# Patient Record
Sex: Male | Born: 1945
Health system: Southern US, Community
[De-identification: ages and names within clinical notes are randomized; demographics above are authoritative.]

---

## 2016-01-30 DIAGNOSIS — M545 Low back pain: Secondary | ICD-10-CM | POA: Diagnosis not present

## 2016-01-30 DIAGNOSIS — I10 Essential (primary) hypertension: Secondary | ICD-10-CM | POA: Diagnosis not present

## 2016-01-30 DIAGNOSIS — Z0000 Encounter for general adult medical examination without abnormal findings: Secondary | ICD-10-CM | POA: Diagnosis not present

## 2016-01-30 DIAGNOSIS — Z681 Body mass index (BMI) 19 or less, adult: Secondary | ICD-10-CM | POA: Diagnosis not present

## 2016-01-30 DIAGNOSIS — J441 Chronic obstructive pulmonary disease with (acute) exacerbation: Secondary | ICD-10-CM | POA: Diagnosis not present

## 2016-03-26 DIAGNOSIS — Z1211 Encounter for screening for malignant neoplasm of colon: Secondary | ICD-10-CM | POA: Diagnosis not present

## 2016-03-27 DIAGNOSIS — I7389 Other specified peripheral vascular diseases: Secondary | ICD-10-CM | POA: Diagnosis not present

## 2016-03-27 DIAGNOSIS — Z0000 Encounter for general adult medical examination without abnormal findings: Secondary | ICD-10-CM | POA: Diagnosis not present

## 2016-03-27 DIAGNOSIS — G40802 Other epilepsy, not intractable, without status epilepticus: Secondary | ICD-10-CM | POA: Diagnosis not present

## 2016-03-27 DIAGNOSIS — I10 Essential (primary) hypertension: Secondary | ICD-10-CM | POA: Diagnosis not present

## 2016-03-27 DIAGNOSIS — I63412 Cerebral infarction due to embolism of left middle cerebral artery: Secondary | ICD-10-CM | POA: Diagnosis not present

## 2016-03-27 DIAGNOSIS — J441 Chronic obstructive pulmonary disease with (acute) exacerbation: Secondary | ICD-10-CM | POA: Diagnosis not present

## 2016-03-27 DIAGNOSIS — Z1389 Encounter for screening for other disorder: Secondary | ICD-10-CM | POA: Diagnosis not present

## 2016-04-05 DIAGNOSIS — I10 Essential (primary) hypertension: Secondary | ICD-10-CM | POA: Diagnosis not present

## 2016-04-05 DIAGNOSIS — F17210 Nicotine dependence, cigarettes, uncomplicated: Secondary | ICD-10-CM | POA: Diagnosis not present

## 2016-04-05 DIAGNOSIS — D125 Benign neoplasm of sigmoid colon: Secondary | ICD-10-CM | POA: Diagnosis not present

## 2016-04-05 DIAGNOSIS — Z882 Allergy status to sulfonamides status: Secondary | ICD-10-CM | POA: Diagnosis not present

## 2016-04-05 DIAGNOSIS — Z8673 Personal history of transient ischemic attack (TIA), and cerebral infarction without residual deficits: Secondary | ICD-10-CM | POA: Diagnosis not present

## 2016-04-05 DIAGNOSIS — Z79899 Other long term (current) drug therapy: Secondary | ICD-10-CM | POA: Diagnosis not present

## 2016-04-05 DIAGNOSIS — Z9049 Acquired absence of other specified parts of digestive tract: Secondary | ICD-10-CM | POA: Diagnosis not present

## 2016-04-05 DIAGNOSIS — Z8489 Family history of other specified conditions: Secondary | ICD-10-CM | POA: Diagnosis not present

## 2016-04-05 DIAGNOSIS — Z1211 Encounter for screening for malignant neoplasm of colon: Secondary | ICD-10-CM | POA: Diagnosis not present

## 2016-05-28 DIAGNOSIS — I63412 Cerebral infarction due to embolism of left middle cerebral artery: Secondary | ICD-10-CM | POA: Diagnosis not present

## 2016-05-28 DIAGNOSIS — I10 Essential (primary) hypertension: Secondary | ICD-10-CM | POA: Diagnosis not present

## 2016-05-28 DIAGNOSIS — I7389 Other specified peripheral vascular diseases: Secondary | ICD-10-CM | POA: Diagnosis not present

## 2016-05-28 DIAGNOSIS — J441 Chronic obstructive pulmonary disease with (acute) exacerbation: Secondary | ICD-10-CM | POA: Diagnosis not present

## 2016-07-27 DIAGNOSIS — I63412 Cerebral infarction due to embolism of left middle cerebral artery: Secondary | ICD-10-CM | POA: Diagnosis not present

## 2016-07-27 DIAGNOSIS — J441 Chronic obstructive pulmonary disease with (acute) exacerbation: Secondary | ICD-10-CM | POA: Diagnosis not present

## 2016-07-27 DIAGNOSIS — I10 Essential (primary) hypertension: Secondary | ICD-10-CM | POA: Diagnosis not present

## 2016-07-27 DIAGNOSIS — I7389 Other specified peripheral vascular diseases: Secondary | ICD-10-CM | POA: Diagnosis not present

## 2016-07-30 DIAGNOSIS — Z79899 Other long term (current) drug therapy: Secondary | ICD-10-CM | POA: Diagnosis not present

## 2016-07-30 DIAGNOSIS — E440 Moderate protein-calorie malnutrition: Secondary | ICD-10-CM | POA: Diagnosis not present

## 2016-07-30 DIAGNOSIS — Z79891 Long term (current) use of opiate analgesic: Secondary | ICD-10-CM | POA: Diagnosis not present

## 2016-07-30 DIAGNOSIS — G8194 Hemiplegia, unspecified affecting left nondominant side: Secondary | ICD-10-CM | POA: Diagnosis not present

## 2016-07-30 DIAGNOSIS — Z7982 Long term (current) use of aspirin: Secondary | ICD-10-CM | POA: Diagnosis not present

## 2016-07-30 DIAGNOSIS — I638 Other cerebral infarction: Secondary | ICD-10-CM | POA: Diagnosis not present

## 2016-07-30 DIAGNOSIS — J449 Chronic obstructive pulmonary disease, unspecified: Secondary | ICD-10-CM | POA: Diagnosis not present

## 2016-07-30 DIAGNOSIS — Z882 Allergy status to sulfonamides status: Secondary | ICD-10-CM | POA: Diagnosis not present

## 2016-07-30 DIAGNOSIS — I6330 Cerebral infarction due to thrombosis of unspecified cerebral artery: Secondary | ICD-10-CM | POA: Diagnosis not present

## 2016-07-30 DIAGNOSIS — I10 Essential (primary) hypertension: Secondary | ICD-10-CM | POA: Diagnosis not present

## 2016-07-30 DIAGNOSIS — R4781 Slurred speech: Secondary | ICD-10-CM | POA: Diagnosis not present

## 2016-07-30 DIAGNOSIS — I639 Cerebral infarction, unspecified: Secondary | ICD-10-CM | POA: Diagnosis not present

## 2016-07-30 DIAGNOSIS — G8929 Other chronic pain: Secondary | ICD-10-CM | POA: Diagnosis not present

## 2016-07-30 DIAGNOSIS — G8112 Spastic hemiplegia affecting left dominant side: Secondary | ICD-10-CM | POA: Diagnosis not present

## 2016-07-30 DIAGNOSIS — E43 Unspecified severe protein-calorie malnutrition: Secondary | ICD-10-CM | POA: Diagnosis not present

## 2016-07-30 DIAGNOSIS — M545 Low back pain: Secondary | ICD-10-CM | POA: Diagnosis not present

## 2016-07-30 DIAGNOSIS — I6523 Occlusion and stenosis of bilateral carotid arteries: Secondary | ICD-10-CM | POA: Diagnosis not present

## 2016-08-02 DIAGNOSIS — Z7982 Long term (current) use of aspirin: Secondary | ICD-10-CM | POA: Diagnosis not present

## 2016-08-02 DIAGNOSIS — Z7901 Long term (current) use of anticoagulants: Secondary | ICD-10-CM | POA: Diagnosis not present

## 2016-08-02 DIAGNOSIS — I69328 Other speech and language deficits following cerebral infarction: Secondary | ICD-10-CM | POA: Diagnosis not present

## 2016-08-02 DIAGNOSIS — I10 Essential (primary) hypertension: Secondary | ICD-10-CM | POA: Diagnosis not present

## 2016-08-02 DIAGNOSIS — M545 Low back pain: Secondary | ICD-10-CM | POA: Diagnosis not present

## 2016-08-02 DIAGNOSIS — J441 Chronic obstructive pulmonary disease with (acute) exacerbation: Secondary | ICD-10-CM | POA: Diagnosis not present

## 2016-08-02 DIAGNOSIS — I69354 Hemiplegia and hemiparesis following cerebral infarction affecting left non-dominant side: Secondary | ICD-10-CM | POA: Diagnosis not present

## 2016-08-02 DIAGNOSIS — J449 Chronic obstructive pulmonary disease, unspecified: Secondary | ICD-10-CM | POA: Diagnosis not present

## 2016-08-03 DIAGNOSIS — I69328 Other speech and language deficits following cerebral infarction: Secondary | ICD-10-CM | POA: Diagnosis not present

## 2016-08-03 DIAGNOSIS — Z7901 Long term (current) use of anticoagulants: Secondary | ICD-10-CM | POA: Diagnosis not present

## 2016-08-03 DIAGNOSIS — J449 Chronic obstructive pulmonary disease, unspecified: Secondary | ICD-10-CM | POA: Diagnosis not present

## 2016-08-03 DIAGNOSIS — Z7982 Long term (current) use of aspirin: Secondary | ICD-10-CM | POA: Diagnosis not present

## 2016-08-03 DIAGNOSIS — M545 Low back pain: Secondary | ICD-10-CM | POA: Diagnosis not present

## 2016-08-03 DIAGNOSIS — I10 Essential (primary) hypertension: Secondary | ICD-10-CM | POA: Diagnosis not present

## 2016-08-03 DIAGNOSIS — I69354 Hemiplegia and hemiparesis following cerebral infarction affecting left non-dominant side: Secondary | ICD-10-CM | POA: Diagnosis not present

## 2016-08-07 DIAGNOSIS — Z7982 Long term (current) use of aspirin: Secondary | ICD-10-CM | POA: Diagnosis not present

## 2016-08-07 DIAGNOSIS — J449 Chronic obstructive pulmonary disease, unspecified: Secondary | ICD-10-CM | POA: Diagnosis not present

## 2016-08-07 DIAGNOSIS — I10 Essential (primary) hypertension: Secondary | ICD-10-CM | POA: Diagnosis not present

## 2016-08-07 DIAGNOSIS — I69354 Hemiplegia and hemiparesis following cerebral infarction affecting left non-dominant side: Secondary | ICD-10-CM | POA: Diagnosis not present

## 2016-08-07 DIAGNOSIS — M545 Low back pain: Secondary | ICD-10-CM | POA: Diagnosis not present

## 2016-08-07 DIAGNOSIS — I69328 Other speech and language deficits following cerebral infarction: Secondary | ICD-10-CM | POA: Diagnosis not present

## 2016-08-07 DIAGNOSIS — Z7901 Long term (current) use of anticoagulants: Secondary | ICD-10-CM | POA: Diagnosis not present

## 2016-08-08 DIAGNOSIS — M545 Low back pain: Secondary | ICD-10-CM | POA: Diagnosis not present

## 2016-08-08 DIAGNOSIS — J449 Chronic obstructive pulmonary disease, unspecified: Secondary | ICD-10-CM | POA: Diagnosis not present

## 2016-08-08 DIAGNOSIS — I69354 Hemiplegia and hemiparesis following cerebral infarction affecting left non-dominant side: Secondary | ICD-10-CM | POA: Diagnosis not present

## 2016-08-08 DIAGNOSIS — I10 Essential (primary) hypertension: Secondary | ICD-10-CM | POA: Diagnosis not present

## 2016-08-08 DIAGNOSIS — I69328 Other speech and language deficits following cerebral infarction: Secondary | ICD-10-CM | POA: Diagnosis not present

## 2016-08-08 DIAGNOSIS — Z7982 Long term (current) use of aspirin: Secondary | ICD-10-CM | POA: Diagnosis not present

## 2016-08-08 DIAGNOSIS — Z7901 Long term (current) use of anticoagulants: Secondary | ICD-10-CM | POA: Diagnosis not present

## 2016-08-09 DIAGNOSIS — I69328 Other speech and language deficits following cerebral infarction: Secondary | ICD-10-CM | POA: Diagnosis not present

## 2016-08-09 DIAGNOSIS — J449 Chronic obstructive pulmonary disease, unspecified: Secondary | ICD-10-CM | POA: Diagnosis not present

## 2016-08-09 DIAGNOSIS — I69354 Hemiplegia and hemiparesis following cerebral infarction affecting left non-dominant side: Secondary | ICD-10-CM | POA: Diagnosis not present

## 2016-08-09 DIAGNOSIS — I10 Essential (primary) hypertension: Secondary | ICD-10-CM | POA: Diagnosis not present

## 2016-08-09 DIAGNOSIS — Z7982 Long term (current) use of aspirin: Secondary | ICD-10-CM | POA: Diagnosis not present

## 2016-08-09 DIAGNOSIS — M545 Low back pain: Secondary | ICD-10-CM | POA: Diagnosis not present

## 2016-08-09 DIAGNOSIS — Z7901 Long term (current) use of anticoagulants: Secondary | ICD-10-CM | POA: Diagnosis not present

## 2016-08-10 DIAGNOSIS — I7389 Other specified peripheral vascular diseases: Secondary | ICD-10-CM | POA: Diagnosis not present

## 2016-08-10 DIAGNOSIS — Z7901 Long term (current) use of anticoagulants: Secondary | ICD-10-CM | POA: Diagnosis not present

## 2016-08-10 DIAGNOSIS — I69328 Other speech and language deficits following cerebral infarction: Secondary | ICD-10-CM | POA: Diagnosis not present

## 2016-08-10 DIAGNOSIS — I10 Essential (primary) hypertension: Secondary | ICD-10-CM | POA: Diagnosis not present

## 2016-08-10 DIAGNOSIS — I69354 Hemiplegia and hemiparesis following cerebral infarction affecting left non-dominant side: Secondary | ICD-10-CM | POA: Diagnosis not present

## 2016-08-10 DIAGNOSIS — M545 Low back pain: Secondary | ICD-10-CM | POA: Diagnosis not present

## 2016-08-10 DIAGNOSIS — J449 Chronic obstructive pulmonary disease, unspecified: Secondary | ICD-10-CM | POA: Diagnosis not present

## 2016-08-10 DIAGNOSIS — Z7982 Long term (current) use of aspirin: Secondary | ICD-10-CM | POA: Diagnosis not present

## 2016-08-10 DIAGNOSIS — I63411 Cerebral infarction due to embolism of right middle cerebral artery: Secondary | ICD-10-CM | POA: Diagnosis not present

## 2016-08-13 DIAGNOSIS — I10 Essential (primary) hypertension: Secondary | ICD-10-CM | POA: Diagnosis not present

## 2016-08-13 DIAGNOSIS — I69354 Hemiplegia and hemiparesis following cerebral infarction affecting left non-dominant side: Secondary | ICD-10-CM | POA: Diagnosis not present

## 2016-08-13 DIAGNOSIS — Z7901 Long term (current) use of anticoagulants: Secondary | ICD-10-CM | POA: Diagnosis not present

## 2016-08-13 DIAGNOSIS — J449 Chronic obstructive pulmonary disease, unspecified: Secondary | ICD-10-CM | POA: Diagnosis not present

## 2016-08-13 DIAGNOSIS — M545 Low back pain: Secondary | ICD-10-CM | POA: Diagnosis not present

## 2016-08-13 DIAGNOSIS — I69328 Other speech and language deficits following cerebral infarction: Secondary | ICD-10-CM | POA: Diagnosis not present

## 2016-08-13 DIAGNOSIS — Z7982 Long term (current) use of aspirin: Secondary | ICD-10-CM | POA: Diagnosis not present

## 2016-08-14 DIAGNOSIS — Z7901 Long term (current) use of anticoagulants: Secondary | ICD-10-CM | POA: Diagnosis not present

## 2016-08-14 DIAGNOSIS — M545 Low back pain: Secondary | ICD-10-CM | POA: Diagnosis not present

## 2016-08-14 DIAGNOSIS — J449 Chronic obstructive pulmonary disease, unspecified: Secondary | ICD-10-CM | POA: Diagnosis not present

## 2016-08-14 DIAGNOSIS — I69328 Other speech and language deficits following cerebral infarction: Secondary | ICD-10-CM | POA: Diagnosis not present

## 2016-08-14 DIAGNOSIS — Z7982 Long term (current) use of aspirin: Secondary | ICD-10-CM | POA: Diagnosis not present

## 2016-08-14 DIAGNOSIS — I69354 Hemiplegia and hemiparesis following cerebral infarction affecting left non-dominant side: Secondary | ICD-10-CM | POA: Diagnosis not present

## 2016-08-14 DIAGNOSIS — I10 Essential (primary) hypertension: Secondary | ICD-10-CM | POA: Diagnosis not present

## 2016-08-15 DIAGNOSIS — I69354 Hemiplegia and hemiparesis following cerebral infarction affecting left non-dominant side: Secondary | ICD-10-CM | POA: Diagnosis not present

## 2016-08-15 DIAGNOSIS — I69328 Other speech and language deficits following cerebral infarction: Secondary | ICD-10-CM | POA: Diagnosis not present

## 2016-08-15 DIAGNOSIS — Z7982 Long term (current) use of aspirin: Secondary | ICD-10-CM | POA: Diagnosis not present

## 2016-08-15 DIAGNOSIS — Z7901 Long term (current) use of anticoagulants: Secondary | ICD-10-CM | POA: Diagnosis not present

## 2016-08-15 DIAGNOSIS — J449 Chronic obstructive pulmonary disease, unspecified: Secondary | ICD-10-CM | POA: Diagnosis not present

## 2016-08-15 DIAGNOSIS — I10 Essential (primary) hypertension: Secondary | ICD-10-CM | POA: Diagnosis not present

## 2016-08-15 DIAGNOSIS — M545 Low back pain: Secondary | ICD-10-CM | POA: Diagnosis not present

## 2016-08-16 DIAGNOSIS — J449 Chronic obstructive pulmonary disease, unspecified: Secondary | ICD-10-CM | POA: Diagnosis not present

## 2016-08-16 DIAGNOSIS — Z7901 Long term (current) use of anticoagulants: Secondary | ICD-10-CM | POA: Diagnosis not present

## 2016-08-16 DIAGNOSIS — I69328 Other speech and language deficits following cerebral infarction: Secondary | ICD-10-CM | POA: Diagnosis not present

## 2016-08-16 DIAGNOSIS — Z7982 Long term (current) use of aspirin: Secondary | ICD-10-CM | POA: Diagnosis not present

## 2016-08-16 DIAGNOSIS — M545 Low back pain: Secondary | ICD-10-CM | POA: Diagnosis not present

## 2016-08-16 DIAGNOSIS — I69354 Hemiplegia and hemiparesis following cerebral infarction affecting left non-dominant side: Secondary | ICD-10-CM | POA: Diagnosis not present

## 2016-08-16 DIAGNOSIS — I10 Essential (primary) hypertension: Secondary | ICD-10-CM | POA: Diagnosis not present

## 2016-08-21 DIAGNOSIS — Z7982 Long term (current) use of aspirin: Secondary | ICD-10-CM | POA: Diagnosis not present

## 2016-08-21 DIAGNOSIS — I69354 Hemiplegia and hemiparesis following cerebral infarction affecting left non-dominant side: Secondary | ICD-10-CM | POA: Diagnosis not present

## 2016-08-21 DIAGNOSIS — J449 Chronic obstructive pulmonary disease, unspecified: Secondary | ICD-10-CM | POA: Diagnosis not present

## 2016-08-21 DIAGNOSIS — I69328 Other speech and language deficits following cerebral infarction: Secondary | ICD-10-CM | POA: Diagnosis not present

## 2016-08-21 DIAGNOSIS — I10 Essential (primary) hypertension: Secondary | ICD-10-CM | POA: Diagnosis not present

## 2016-08-21 DIAGNOSIS — M545 Low back pain: Secondary | ICD-10-CM | POA: Diagnosis not present

## 2016-08-21 DIAGNOSIS — Z7901 Long term (current) use of anticoagulants: Secondary | ICD-10-CM | POA: Diagnosis not present

## 2016-09-01 DIAGNOSIS — I6330 Cerebral infarction due to thrombosis of unspecified cerebral artery: Secondary | ICD-10-CM | POA: Diagnosis not present

## 2016-09-05 DIAGNOSIS — G894 Chronic pain syndrome: Secondary | ICD-10-CM | POA: Diagnosis not present

## 2016-09-05 DIAGNOSIS — M545 Low back pain: Secondary | ICD-10-CM | POA: Diagnosis not present

## 2016-09-05 DIAGNOSIS — Z79891 Long term (current) use of opiate analgesic: Secondary | ICD-10-CM | POA: Diagnosis not present

## 2016-09-25 DIAGNOSIS — I63411 Cerebral infarction due to embolism of right middle cerebral artery: Secondary | ICD-10-CM | POA: Diagnosis not present

## 2016-09-25 DIAGNOSIS — E440 Moderate protein-calorie malnutrition: Secondary | ICD-10-CM | POA: Diagnosis not present

## 2016-10-01 DIAGNOSIS — I6330 Cerebral infarction due to thrombosis of unspecified cerebral artery: Secondary | ICD-10-CM | POA: Diagnosis not present

## 2016-11-01 DIAGNOSIS — I6330 Cerebral infarction due to thrombosis of unspecified cerebral artery: Secondary | ICD-10-CM | POA: Diagnosis not present

## 2016-11-22 DIAGNOSIS — Z79899 Other long term (current) drug therapy: Secondary | ICD-10-CM | POA: Diagnosis not present

## 2016-11-22 DIAGNOSIS — M545 Low back pain: Secondary | ICD-10-CM | POA: Diagnosis not present

## 2016-11-22 DIAGNOSIS — E440 Moderate protein-calorie malnutrition: Secondary | ICD-10-CM | POA: Diagnosis not present

## 2016-12-01 DIAGNOSIS — I6330 Cerebral infarction due to thrombosis of unspecified cerebral artery: Secondary | ICD-10-CM | POA: Diagnosis not present

## 2017-01-01 DIAGNOSIS — I6330 Cerebral infarction due to thrombosis of unspecified cerebral artery: Secondary | ICD-10-CM | POA: Diagnosis not present

## 2017-01-22 DIAGNOSIS — I638 Other cerebral infarction: Secondary | ICD-10-CM | POA: Diagnosis not present

## 2017-01-22 DIAGNOSIS — M545 Low back pain: Secondary | ICD-10-CM | POA: Diagnosis not present

## 2017-01-22 DIAGNOSIS — J440 Chronic obstructive pulmonary disease with acute lower respiratory infection: Secondary | ICD-10-CM | POA: Diagnosis not present

## 2017-01-22 DIAGNOSIS — Z0000 Encounter for general adult medical examination without abnormal findings: Secondary | ICD-10-CM | POA: Diagnosis not present

## 2017-01-22 DIAGNOSIS — Z1389 Encounter for screening for other disorder: Secondary | ICD-10-CM | POA: Diagnosis not present

## 2017-01-22 DIAGNOSIS — Z125 Encounter for screening for malignant neoplasm of prostate: Secondary | ICD-10-CM | POA: Diagnosis not present

## 2017-01-30 DIAGNOSIS — I6330 Cerebral infarction due to thrombosis of unspecified cerebral artery: Secondary | ICD-10-CM | POA: Diagnosis not present

## 2017-01-31 DIAGNOSIS — Z87891 Personal history of nicotine dependence: Secondary | ICD-10-CM | POA: Diagnosis not present

## 2017-01-31 DIAGNOSIS — Z136 Encounter for screening for cardiovascular disorders: Secondary | ICD-10-CM | POA: Diagnosis not present

## 2017-03-01 DIAGNOSIS — I6330 Cerebral infarction due to thrombosis of unspecified cerebral artery: Secondary | ICD-10-CM | POA: Diagnosis not present

## 2017-03-22 DIAGNOSIS — M25511 Pain in right shoulder: Secondary | ICD-10-CM | POA: Diagnosis not present

## 2017-03-22 DIAGNOSIS — Z0000 Encounter for general adult medical examination without abnormal findings: Secondary | ICD-10-CM | POA: Diagnosis not present

## 2017-03-22 DIAGNOSIS — I638 Other cerebral infarction: Secondary | ICD-10-CM | POA: Diagnosis not present

## 2017-03-22 DIAGNOSIS — J440 Chronic obstructive pulmonary disease with acute lower respiratory infection: Secondary | ICD-10-CM | POA: Diagnosis not present

## 2017-03-22 DIAGNOSIS — M545 Low back pain: Secondary | ICD-10-CM | POA: Diagnosis not present

## 2017-04-01 DIAGNOSIS — I6330 Cerebral infarction due to thrombosis of unspecified cerebral artery: Secondary | ICD-10-CM | POA: Diagnosis not present

## 2017-05-01 DIAGNOSIS — I6330 Cerebral infarction due to thrombosis of unspecified cerebral artery: Secondary | ICD-10-CM | POA: Diagnosis not present

## 2017-05-23 DIAGNOSIS — I10 Essential (primary) hypertension: Secondary | ICD-10-CM | POA: Diagnosis not present

## 2017-05-23 DIAGNOSIS — I638 Other cerebral infarction: Secondary | ICD-10-CM | POA: Diagnosis not present

## 2017-05-23 DIAGNOSIS — M25511 Pain in right shoulder: Secondary | ICD-10-CM | POA: Diagnosis not present

## 2017-05-23 DIAGNOSIS — J440 Chronic obstructive pulmonary disease with acute lower respiratory infection: Secondary | ICD-10-CM | POA: Diagnosis not present

## 2017-07-29 DIAGNOSIS — J440 Chronic obstructive pulmonary disease with acute lower respiratory infection: Secondary | ICD-10-CM | POA: Diagnosis not present

## 2017-07-29 DIAGNOSIS — I10 Essential (primary) hypertension: Secondary | ICD-10-CM | POA: Diagnosis not present

## 2017-07-29 DIAGNOSIS — M25511 Pain in right shoulder: Secondary | ICD-10-CM | POA: Diagnosis not present

## 2017-07-29 DIAGNOSIS — I638 Other cerebral infarction: Secondary | ICD-10-CM | POA: Diagnosis not present

## 2017-09-30 DIAGNOSIS — I6389 Other cerebral infarction: Secondary | ICD-10-CM | POA: Diagnosis not present

## 2017-09-30 DIAGNOSIS — M545 Low back pain: Secondary | ICD-10-CM | POA: Diagnosis not present

## 2017-09-30 DIAGNOSIS — J440 Chronic obstructive pulmonary disease with acute lower respiratory infection: Secondary | ICD-10-CM | POA: Diagnosis not present

## 2017-09-30 DIAGNOSIS — I10 Essential (primary) hypertension: Secondary | ICD-10-CM | POA: Diagnosis not present

## 2017-11-29 DIAGNOSIS — I10 Essential (primary) hypertension: Secondary | ICD-10-CM | POA: Diagnosis not present

## 2017-11-29 DIAGNOSIS — J441 Chronic obstructive pulmonary disease with (acute) exacerbation: Secondary | ICD-10-CM | POA: Diagnosis not present

## 2017-11-29 DIAGNOSIS — M545 Low back pain: Secondary | ICD-10-CM | POA: Diagnosis not present

## 2018-01-28 DIAGNOSIS — J441 Chronic obstructive pulmonary disease with (acute) exacerbation: Secondary | ICD-10-CM | POA: Diagnosis not present

## 2018-01-28 DIAGNOSIS — E440 Moderate protein-calorie malnutrition: Secondary | ICD-10-CM | POA: Diagnosis not present

## 2018-01-28 DIAGNOSIS — M545 Low back pain: Secondary | ICD-10-CM | POA: Diagnosis not present

## 2018-01-28 DIAGNOSIS — I10 Essential (primary) hypertension: Secondary | ICD-10-CM | POA: Diagnosis not present

## 2018-03-27 DIAGNOSIS — G40309 Generalized idiopathic epilepsy and epileptic syndromes, not intractable, without status epilepticus: Secondary | ICD-10-CM | POA: Diagnosis not present

## 2018-03-27 DIAGNOSIS — I10 Essential (primary) hypertension: Secondary | ICD-10-CM | POA: Diagnosis not present

## 2018-03-27 DIAGNOSIS — Z0000 Encounter for general adult medical examination without abnormal findings: Secondary | ICD-10-CM | POA: Diagnosis not present

## 2018-03-27 DIAGNOSIS — E440 Moderate protein-calorie malnutrition: Secondary | ICD-10-CM | POA: Diagnosis not present

## 2018-03-27 DIAGNOSIS — Z1389 Encounter for screening for other disorder: Secondary | ICD-10-CM | POA: Diagnosis not present

## 2018-03-27 DIAGNOSIS — M545 Low back pain: Secondary | ICD-10-CM | POA: Diagnosis not present

## 2018-03-27 DIAGNOSIS — J441 Chronic obstructive pulmonary disease with (acute) exacerbation: Secondary | ICD-10-CM | POA: Diagnosis not present

## 2018-03-27 DIAGNOSIS — Z681 Body mass index (BMI) 19 or less, adult: Secondary | ICD-10-CM | POA: Diagnosis not present

## 2018-03-27 DIAGNOSIS — R69 Illness, unspecified: Secondary | ICD-10-CM | POA: Diagnosis not present

## 2018-05-05 DIAGNOSIS — M130 Polyarthritis, unspecified: Secondary | ICD-10-CM | POA: Diagnosis not present

## 2018-05-05 DIAGNOSIS — M545 Low back pain: Secondary | ICD-10-CM | POA: Diagnosis not present

## 2018-05-05 DIAGNOSIS — M542 Cervicalgia: Secondary | ICD-10-CM | POA: Diagnosis not present

## 2018-05-05 DIAGNOSIS — G603 Idiopathic progressive neuropathy: Secondary | ICD-10-CM | POA: Diagnosis not present

## 2018-05-05 DIAGNOSIS — M79672 Pain in left foot: Secondary | ICD-10-CM | POA: Diagnosis not present

## 2018-05-12 ENCOUNTER — Other Ambulatory Visit (HOSPITAL_COMMUNITY): Payer: Self-pay | Admitting: Neurology

## 2018-05-12 ENCOUNTER — Ambulatory Visit (HOSPITAL_COMMUNITY)
Admission: RE | Admit: 2018-05-12 | Discharge: 2018-05-12 | Disposition: A | Discharge status: Home / Self Care | Payer: Medicare HMO | Source: Ambulatory Visit | Attending: Neurology | Admitting: Neurology

## 2018-05-12 DIAGNOSIS — M5440 Lumbago with sciatica, unspecified side: Secondary | ICD-10-CM

## 2018-05-12 DIAGNOSIS — M545 Low back pain: Secondary | ICD-10-CM | POA: Insufficient documentation

## 2018-05-12 DIAGNOSIS — M5136 Other intervertebral disc degeneration, lumbar region: Secondary | ICD-10-CM | POA: Diagnosis not present

## 2018-05-12 DIAGNOSIS — I7090 Unspecified atherosclerosis: Secondary | ICD-10-CM | POA: Insufficient documentation

## 2018-06-06 DIAGNOSIS — J441 Chronic obstructive pulmonary disease with (acute) exacerbation: Secondary | ICD-10-CM | POA: Diagnosis not present

## 2018-06-06 DIAGNOSIS — R69 Illness, unspecified: Secondary | ICD-10-CM | POA: Diagnosis not present

## 2018-06-06 DIAGNOSIS — E440 Moderate protein-calorie malnutrition: Secondary | ICD-10-CM | POA: Diagnosis not present

## 2018-06-06 DIAGNOSIS — G40309 Generalized idiopathic epilepsy and epileptic syndromes, not intractable, without status epilepticus: Secondary | ICD-10-CM | POA: Diagnosis not present

## 2018-06-06 DIAGNOSIS — I10 Essential (primary) hypertension: Secondary | ICD-10-CM | POA: Diagnosis not present

## 2018-06-06 DIAGNOSIS — Z681 Body mass index (BMI) 19 or less, adult: Secondary | ICD-10-CM | POA: Diagnosis not present

## 2018-06-06 DIAGNOSIS — M545 Low back pain: Secondary | ICD-10-CM | POA: Diagnosis not present

## 2018-06-13 DIAGNOSIS — R339 Retention of urine, unspecified: Secondary | ICD-10-CM | POA: Diagnosis not present

## 2018-06-13 DIAGNOSIS — S60222A Contusion of left hand, initial encounter: Secondary | ICD-10-CM | POA: Diagnosis not present

## 2018-06-13 DIAGNOSIS — S60229A Contusion of unspecified hand, initial encounter: Secondary | ICD-10-CM | POA: Diagnosis not present

## 2018-06-13 DIAGNOSIS — S6992XA Unspecified injury of left wrist, hand and finger(s), initial encounter: Secondary | ICD-10-CM | POA: Diagnosis not present

## 2018-06-13 DIAGNOSIS — I10 Essential (primary) hypertension: Secondary | ICD-10-CM | POA: Diagnosis not present

## 2018-06-13 DIAGNOSIS — Z7982 Long term (current) use of aspirin: Secondary | ICD-10-CM | POA: Diagnosis not present

## 2018-06-13 DIAGNOSIS — S60512A Abrasion of left hand, initial encounter: Secondary | ICD-10-CM | POA: Diagnosis not present

## 2018-06-13 DIAGNOSIS — W1809XA Striking against other object with subsequent fall, initial encounter: Secondary | ICD-10-CM | POA: Diagnosis not present

## 2018-06-13 DIAGNOSIS — S60511A Abrasion of right hand, initial encounter: Secondary | ICD-10-CM | POA: Diagnosis not present

## 2018-06-13 DIAGNOSIS — R69 Illness, unspecified: Secondary | ICD-10-CM | POA: Diagnosis not present

## 2018-06-13 DIAGNOSIS — R569 Unspecified convulsions: Secondary | ICD-10-CM | POA: Diagnosis not present

## 2018-06-13 DIAGNOSIS — Z79899 Other long term (current) drug therapy: Secondary | ICD-10-CM | POA: Diagnosis not present

## 2018-06-13 DIAGNOSIS — W010XXA Fall on same level from slipping, tripping and stumbling without subsequent striking against object, initial encounter: Secondary | ICD-10-CM | POA: Diagnosis not present

## 2018-07-15 DIAGNOSIS — M542 Cervicalgia: Secondary | ICD-10-CM | POA: Diagnosis not present

## 2018-07-15 DIAGNOSIS — G603 Idiopathic progressive neuropathy: Secondary | ICD-10-CM | POA: Diagnosis not present

## 2018-07-15 DIAGNOSIS — M130 Polyarthritis, unspecified: Secondary | ICD-10-CM | POA: Diagnosis not present

## 2018-07-15 DIAGNOSIS — M545 Low back pain: Secondary | ICD-10-CM | POA: Diagnosis not present

## 2018-07-24 DIAGNOSIS — E039 Hypothyroidism, unspecified: Secondary | ICD-10-CM | POA: Diagnosis not present

## 2018-07-24 DIAGNOSIS — D519 Vitamin B12 deficiency anemia, unspecified: Secondary | ICD-10-CM | POA: Diagnosis not present

## 2018-07-24 DIAGNOSIS — R5383 Other fatigue: Secondary | ICD-10-CM | POA: Diagnosis not present

## 2018-07-24 DIAGNOSIS — E559 Vitamin D deficiency, unspecified: Secondary | ICD-10-CM | POA: Diagnosis not present

## 2018-07-24 DIAGNOSIS — G603 Idiopathic progressive neuropathy: Secondary | ICD-10-CM | POA: Diagnosis not present

## 2018-07-24 DIAGNOSIS — E569 Vitamin deficiency, unspecified: Secondary | ICD-10-CM | POA: Diagnosis not present

## 2018-07-24 DIAGNOSIS — M5416 Radiculopathy, lumbar region: Secondary | ICD-10-CM | POA: Diagnosis not present

## 2018-07-24 DIAGNOSIS — Z79899 Other long term (current) drug therapy: Secondary | ICD-10-CM | POA: Diagnosis not present

## 2018-08-15 DIAGNOSIS — M542 Cervicalgia: Secondary | ICD-10-CM | POA: Diagnosis not present

## 2018-08-15 DIAGNOSIS — M130 Polyarthritis, unspecified: Secondary | ICD-10-CM | POA: Diagnosis not present

## 2018-08-15 DIAGNOSIS — G603 Idiopathic progressive neuropathy: Secondary | ICD-10-CM | POA: Diagnosis not present

## 2018-08-15 DIAGNOSIS — M545 Low back pain: Secondary | ICD-10-CM | POA: Diagnosis not present

## 2018-09-11 DIAGNOSIS — M542 Cervicalgia: Secondary | ICD-10-CM | POA: Diagnosis not present

## 2018-09-11 DIAGNOSIS — Z79891 Long term (current) use of opiate analgesic: Secondary | ICD-10-CM | POA: Diagnosis not present

## 2018-09-11 DIAGNOSIS — G603 Idiopathic progressive neuropathy: Secondary | ICD-10-CM | POA: Diagnosis not present

## 2018-09-11 DIAGNOSIS — M545 Low back pain: Secondary | ICD-10-CM | POA: Diagnosis not present

## 2018-09-11 DIAGNOSIS — M130 Polyarthritis, unspecified: Secondary | ICD-10-CM | POA: Diagnosis not present

## 2018-09-24 DIAGNOSIS — M545 Low back pain: Secondary | ICD-10-CM | POA: Diagnosis not present

## 2018-09-24 DIAGNOSIS — Z681 Body mass index (BMI) 19 or less, adult: Secondary | ICD-10-CM | POA: Diagnosis not present

## 2018-09-24 DIAGNOSIS — R69 Illness, unspecified: Secondary | ICD-10-CM | POA: Diagnosis not present

## 2018-09-24 DIAGNOSIS — I10 Essential (primary) hypertension: Secondary | ICD-10-CM | POA: Diagnosis not present

## 2018-09-24 DIAGNOSIS — J441 Chronic obstructive pulmonary disease with (acute) exacerbation: Secondary | ICD-10-CM | POA: Diagnosis not present

## 2018-09-24 DIAGNOSIS — Z1159 Encounter for screening for other viral diseases: Secondary | ICD-10-CM | POA: Diagnosis not present

## 2018-09-24 DIAGNOSIS — E440 Moderate protein-calorie malnutrition: Secondary | ICD-10-CM | POA: Diagnosis not present

## 2018-09-24 DIAGNOSIS — G40309 Generalized idiopathic epilepsy and epileptic syndromes, not intractable, without status epilepticus: Secondary | ICD-10-CM | POA: Diagnosis not present

## 2018-10-02 DIAGNOSIS — G458 Other transient cerebral ischemic attacks and related syndromes: Secondary | ICD-10-CM | POA: Diagnosis not present

## 2018-10-02 DIAGNOSIS — I739 Peripheral vascular disease, unspecified: Secondary | ICD-10-CM | POA: Diagnosis not present

## 2018-10-08 DIAGNOSIS — M542 Cervicalgia: Secondary | ICD-10-CM | POA: Diagnosis not present

## 2018-10-08 DIAGNOSIS — M545 Low back pain: Secondary | ICD-10-CM | POA: Diagnosis not present

## 2018-10-08 DIAGNOSIS — Z79891 Long term (current) use of opiate analgesic: Secondary | ICD-10-CM | POA: Diagnosis not present

## 2018-10-08 DIAGNOSIS — M130 Polyarthritis, unspecified: Secondary | ICD-10-CM | POA: Diagnosis not present

## 2018-10-08 DIAGNOSIS — G603 Idiopathic progressive neuropathy: Secondary | ICD-10-CM | POA: Diagnosis not present

## 2018-11-06 DIAGNOSIS — M542 Cervicalgia: Secondary | ICD-10-CM | POA: Diagnosis not present

## 2018-11-06 DIAGNOSIS — M545 Low back pain: Secondary | ICD-10-CM | POA: Diagnosis not present

## 2018-11-06 DIAGNOSIS — G603 Idiopathic progressive neuropathy: Secondary | ICD-10-CM | POA: Diagnosis not present

## 2018-11-06 DIAGNOSIS — Z79891 Long term (current) use of opiate analgesic: Secondary | ICD-10-CM | POA: Diagnosis not present

## 2018-11-06 DIAGNOSIS — M130 Polyarthritis, unspecified: Secondary | ICD-10-CM | POA: Diagnosis not present

## 2018-12-04 DIAGNOSIS — G603 Idiopathic progressive neuropathy: Secondary | ICD-10-CM | POA: Diagnosis not present

## 2018-12-04 DIAGNOSIS — Z79891 Long term (current) use of opiate analgesic: Secondary | ICD-10-CM | POA: Diagnosis not present

## 2018-12-04 DIAGNOSIS — M542 Cervicalgia: Secondary | ICD-10-CM | POA: Diagnosis not present

## 2018-12-04 DIAGNOSIS — M130 Polyarthritis, unspecified: Secondary | ICD-10-CM | POA: Diagnosis not present

## 2018-12-04 DIAGNOSIS — M545 Low back pain: Secondary | ICD-10-CM | POA: Diagnosis not present

## 2018-12-13 DIAGNOSIS — G8194 Hemiplegia, unspecified affecting left nondominant side: Secondary | ICD-10-CM | POA: Diagnosis not present

## 2018-12-13 DIAGNOSIS — I6781 Acute cerebrovascular insufficiency: Secondary | ICD-10-CM | POA: Diagnosis not present

## 2018-12-13 DIAGNOSIS — I639 Cerebral infarction, unspecified: Secondary | ICD-10-CM | POA: Diagnosis not present

## 2018-12-13 DIAGNOSIS — J449 Chronic obstructive pulmonary disease, unspecified: Secondary | ICD-10-CM | POA: Diagnosis not present

## 2018-12-13 DIAGNOSIS — I10 Essential (primary) hypertension: Secondary | ICD-10-CM | POA: Diagnosis not present

## 2018-12-13 DIAGNOSIS — R69 Illness, unspecified: Secondary | ICD-10-CM | POA: Diagnosis not present

## 2018-12-13 DIAGNOSIS — R479 Unspecified speech disturbances: Secondary | ICD-10-CM | POA: Diagnosis not present

## 2018-12-13 DIAGNOSIS — G8929 Other chronic pain: Secondary | ICD-10-CM | POA: Diagnosis not present

## 2018-12-13 DIAGNOSIS — R531 Weakness: Secondary | ICD-10-CM | POA: Diagnosis not present

## 2018-12-13 DIAGNOSIS — M545 Low back pain: Secondary | ICD-10-CM | POA: Diagnosis not present

## 2018-12-13 DIAGNOSIS — E440 Moderate protein-calorie malnutrition: Secondary | ICD-10-CM | POA: Diagnosis not present

## 2018-12-13 DIAGNOSIS — R569 Unspecified convulsions: Secondary | ICD-10-CM | POA: Diagnosis not present

## 2018-12-13 DIAGNOSIS — R4781 Slurred speech: Secondary | ICD-10-CM | POA: Diagnosis not present

## 2018-12-14 DIAGNOSIS — R69 Illness, unspecified: Secondary | ICD-10-CM | POA: Diagnosis not present

## 2018-12-14 DIAGNOSIS — M545 Low back pain: Secondary | ICD-10-CM | POA: Diagnosis not present

## 2018-12-14 DIAGNOSIS — G8929 Other chronic pain: Secondary | ICD-10-CM | POA: Diagnosis not present

## 2018-12-14 DIAGNOSIS — J449 Chronic obstructive pulmonary disease, unspecified: Secondary | ICD-10-CM | POA: Diagnosis not present

## 2018-12-14 DIAGNOSIS — I10 Essential (primary) hypertension: Secondary | ICD-10-CM | POA: Diagnosis not present

## 2018-12-14 DIAGNOSIS — I6781 Acute cerebrovascular insufficiency: Secondary | ICD-10-CM | POA: Diagnosis not present

## 2018-12-14 DIAGNOSIS — R569 Unspecified convulsions: Secondary | ICD-10-CM | POA: Diagnosis not present

## 2018-12-14 DIAGNOSIS — I679 Cerebrovascular disease, unspecified: Secondary | ICD-10-CM | POA: Diagnosis not present

## 2018-12-14 DIAGNOSIS — R4781 Slurred speech: Secondary | ICD-10-CM | POA: Diagnosis not present

## 2018-12-14 DIAGNOSIS — H7491 Unspecified disorder of right middle ear and mastoid: Secondary | ICD-10-CM | POA: Diagnosis not present

## 2018-12-14 DIAGNOSIS — R479 Unspecified speech disturbances: Secondary | ICD-10-CM | POA: Diagnosis not present

## 2018-12-14 DIAGNOSIS — R531 Weakness: Secondary | ICD-10-CM | POA: Diagnosis not present

## 2018-12-14 DIAGNOSIS — G8194 Hemiplegia, unspecified affecting left nondominant side: Secondary | ICD-10-CM | POA: Diagnosis not present

## 2018-12-14 DIAGNOSIS — E440 Moderate protein-calorie malnutrition: Secondary | ICD-10-CM | POA: Diagnosis not present

## 2018-12-15 DIAGNOSIS — H7491 Unspecified disorder of right middle ear and mastoid: Secondary | ICD-10-CM | POA: Diagnosis not present

## 2018-12-15 DIAGNOSIS — J449 Chronic obstructive pulmonary disease, unspecified: Secondary | ICD-10-CM | POA: Diagnosis not present

## 2018-12-15 DIAGNOSIS — G8194 Hemiplegia, unspecified affecting left nondominant side: Secondary | ICD-10-CM | POA: Diagnosis not present

## 2018-12-15 DIAGNOSIS — E440 Moderate protein-calorie malnutrition: Secondary | ICD-10-CM | POA: Diagnosis not present

## 2018-12-15 DIAGNOSIS — R4781 Slurred speech: Secondary | ICD-10-CM | POA: Diagnosis not present

## 2018-12-15 DIAGNOSIS — I6781 Acute cerebrovascular insufficiency: Secondary | ICD-10-CM | POA: Diagnosis not present

## 2018-12-15 DIAGNOSIS — I679 Cerebrovascular disease, unspecified: Secondary | ICD-10-CM | POA: Diagnosis not present

## 2018-12-16 DIAGNOSIS — J449 Chronic obstructive pulmonary disease, unspecified: Secondary | ICD-10-CM | POA: Diagnosis not present

## 2018-12-16 DIAGNOSIS — E440 Moderate protein-calorie malnutrition: Secondary | ICD-10-CM | POA: Diagnosis not present

## 2018-12-16 DIAGNOSIS — R4781 Slurred speech: Secondary | ICD-10-CM | POA: Diagnosis not present

## 2018-12-16 DIAGNOSIS — I6781 Acute cerebrovascular insufficiency: Secondary | ICD-10-CM | POA: Diagnosis not present

## 2018-12-16 DIAGNOSIS — G8194 Hemiplegia, unspecified affecting left nondominant side: Secondary | ICD-10-CM | POA: Diagnosis not present

## 2019-01-05 DIAGNOSIS — G40309 Generalized idiopathic epilepsy and epileptic syndromes, not intractable, without status epilepticus: Secondary | ICD-10-CM | POA: Diagnosis not present

## 2019-01-05 DIAGNOSIS — M545 Low back pain: Secondary | ICD-10-CM | POA: Diagnosis not present

## 2019-01-05 DIAGNOSIS — R69 Illness, unspecified: Secondary | ICD-10-CM | POA: Diagnosis not present

## 2019-01-05 DIAGNOSIS — I10 Essential (primary) hypertension: Secondary | ICD-10-CM | POA: Diagnosis not present

## 2019-01-05 DIAGNOSIS — J441 Chronic obstructive pulmonary disease with (acute) exacerbation: Secondary | ICD-10-CM | POA: Diagnosis not present

## 2019-01-05 DIAGNOSIS — Z681 Body mass index (BMI) 19 or less, adult: Secondary | ICD-10-CM | POA: Diagnosis not present

## 2019-01-05 DIAGNOSIS — E440 Moderate protein-calorie malnutrition: Secondary | ICD-10-CM | POA: Diagnosis not present

## 2019-02-26 DIAGNOSIS — Z79891 Long term (current) use of opiate analgesic: Secondary | ICD-10-CM | POA: Diagnosis not present

## 2019-02-26 DIAGNOSIS — M542 Cervicalgia: Secondary | ICD-10-CM | POA: Diagnosis not present

## 2019-02-26 DIAGNOSIS — M130 Polyarthritis, unspecified: Secondary | ICD-10-CM | POA: Diagnosis not present

## 2019-02-26 DIAGNOSIS — G603 Idiopathic progressive neuropathy: Secondary | ICD-10-CM | POA: Diagnosis not present

## 2019-04-08 DIAGNOSIS — M542 Cervicalgia: Secondary | ICD-10-CM | POA: Diagnosis not present

## 2019-04-08 DIAGNOSIS — M130 Polyarthritis, unspecified: Secondary | ICD-10-CM | POA: Diagnosis not present

## 2019-04-08 DIAGNOSIS — G603 Idiopathic progressive neuropathy: Secondary | ICD-10-CM | POA: Diagnosis not present

## 2019-04-08 DIAGNOSIS — Z79891 Long term (current) use of opiate analgesic: Secondary | ICD-10-CM | POA: Diagnosis not present

## 2019-04-30 DIAGNOSIS — E440 Moderate protein-calorie malnutrition: Secondary | ICD-10-CM | POA: Diagnosis not present

## 2019-04-30 DIAGNOSIS — M545 Low back pain: Secondary | ICD-10-CM | POA: Diagnosis not present

## 2019-04-30 DIAGNOSIS — Z0000 Encounter for general adult medical examination without abnormal findings: Secondary | ICD-10-CM | POA: Diagnosis not present

## 2019-04-30 DIAGNOSIS — Z125 Encounter for screening for malignant neoplasm of prostate: Secondary | ICD-10-CM | POA: Diagnosis not present

## 2019-04-30 DIAGNOSIS — Z1389 Encounter for screening for other disorder: Secondary | ICD-10-CM | POA: Diagnosis not present

## 2019-04-30 DIAGNOSIS — Z681 Body mass index (BMI) 19 or less, adult: Secondary | ICD-10-CM | POA: Diagnosis not present

## 2019-04-30 DIAGNOSIS — J449 Chronic obstructive pulmonary disease, unspecified: Secondary | ICD-10-CM | POA: Diagnosis not present

## 2019-04-30 DIAGNOSIS — G40309 Generalized idiopathic epilepsy and epileptic syndromes, not intractable, without status epilepticus: Secondary | ICD-10-CM | POA: Diagnosis not present

## 2019-04-30 DIAGNOSIS — R69 Illness, unspecified: Secondary | ICD-10-CM | POA: Diagnosis not present

## 2019-04-30 DIAGNOSIS — I10 Essential (primary) hypertension: Secondary | ICD-10-CM | POA: Diagnosis not present

## 2019-06-03 DIAGNOSIS — M130 Polyarthritis, unspecified: Secondary | ICD-10-CM | POA: Diagnosis not present

## 2019-06-03 DIAGNOSIS — Z79891 Long term (current) use of opiate analgesic: Secondary | ICD-10-CM | POA: Diagnosis not present

## 2019-06-03 DIAGNOSIS — M542 Cervicalgia: Secondary | ICD-10-CM | POA: Diagnosis not present

## 2019-06-03 DIAGNOSIS — G603 Idiopathic progressive neuropathy: Secondary | ICD-10-CM | POA: Diagnosis not present

## 2019-07-29 DIAGNOSIS — M542 Cervicalgia: Secondary | ICD-10-CM | POA: Diagnosis not present

## 2019-07-29 DIAGNOSIS — Z79891 Long term (current) use of opiate analgesic: Secondary | ICD-10-CM | POA: Diagnosis not present

## 2019-07-29 DIAGNOSIS — G603 Idiopathic progressive neuropathy: Secondary | ICD-10-CM | POA: Diagnosis not present

## 2019-07-29 DIAGNOSIS — M130 Polyarthritis, unspecified: Secondary | ICD-10-CM | POA: Diagnosis not present

## 2019-07-30 DIAGNOSIS — G40309 Generalized idiopathic epilepsy and epileptic syndromes, not intractable, without status epilepticus: Secondary | ICD-10-CM | POA: Diagnosis not present

## 2019-07-30 DIAGNOSIS — R69 Illness, unspecified: Secondary | ICD-10-CM | POA: Diagnosis not present

## 2019-07-30 DIAGNOSIS — I10 Essential (primary) hypertension: Secondary | ICD-10-CM | POA: Diagnosis not present

## 2019-07-30 DIAGNOSIS — E440 Moderate protein-calorie malnutrition: Secondary | ICD-10-CM | POA: Diagnosis not present

## 2019-07-30 DIAGNOSIS — Z681 Body mass index (BMI) 19 or less, adult: Secondary | ICD-10-CM | POA: Diagnosis not present

## 2019-07-30 DIAGNOSIS — M545 Low back pain: Secondary | ICD-10-CM | POA: Diagnosis not present

## 2019-07-30 DIAGNOSIS — J449 Chronic obstructive pulmonary disease, unspecified: Secondary | ICD-10-CM | POA: Diagnosis not present

## 2019-08-05 DIAGNOSIS — H353110 Nonexudative age-related macular degeneration, right eye, stage unspecified: Secondary | ICD-10-CM | POA: Diagnosis not present

## 2019-09-23 DIAGNOSIS — Z79891 Long term (current) use of opiate analgesic: Secondary | ICD-10-CM | POA: Diagnosis not present

## 2019-09-23 DIAGNOSIS — M542 Cervicalgia: Secondary | ICD-10-CM | POA: Diagnosis not present

## 2019-09-23 DIAGNOSIS — G603 Idiopathic progressive neuropathy: Secondary | ICD-10-CM | POA: Diagnosis not present

## 2019-09-23 DIAGNOSIS — M130 Polyarthritis, unspecified: Secondary | ICD-10-CM | POA: Diagnosis not present

## 2019-10-31 DIAGNOSIS — R69 Illness, unspecified: Secondary | ICD-10-CM | POA: Diagnosis not present

## 2019-11-05 DIAGNOSIS — J449 Chronic obstructive pulmonary disease, unspecified: Secondary | ICD-10-CM | POA: Diagnosis not present

## 2019-11-05 DIAGNOSIS — R69 Illness, unspecified: Secondary | ICD-10-CM | POA: Diagnosis not present

## 2019-11-05 DIAGNOSIS — M545 Low back pain: Secondary | ICD-10-CM | POA: Diagnosis not present

## 2019-11-05 DIAGNOSIS — Z681 Body mass index (BMI) 19 or less, adult: Secondary | ICD-10-CM | POA: Diagnosis not present

## 2019-11-05 DIAGNOSIS — E440 Moderate protein-calorie malnutrition: Secondary | ICD-10-CM | POA: Diagnosis not present

## 2019-11-05 DIAGNOSIS — G40309 Generalized idiopathic epilepsy and epileptic syndromes, not intractable, without status epilepticus: Secondary | ICD-10-CM | POA: Diagnosis not present

## 2019-11-05 DIAGNOSIS — I10 Essential (primary) hypertension: Secondary | ICD-10-CM | POA: Diagnosis not present

## 2019-12-10 DIAGNOSIS — H524 Presbyopia: Secondary | ICD-10-CM | POA: Diagnosis not present

## 2019-12-16 DIAGNOSIS — M5136 Other intervertebral disc degeneration, lumbar region: Secondary | ICD-10-CM | POA: Diagnosis not present

## 2019-12-16 DIAGNOSIS — M419 Scoliosis, unspecified: Secondary | ICD-10-CM | POA: Diagnosis not present

## 2019-12-16 DIAGNOSIS — M47816 Spondylosis without myelopathy or radiculopathy, lumbar region: Secondary | ICD-10-CM | POA: Diagnosis not present

## 2019-12-16 DIAGNOSIS — M545 Low back pain: Secondary | ICD-10-CM | POA: Diagnosis not present

## 2019-12-17 DIAGNOSIS — Z79891 Long term (current) use of opiate analgesic: Secondary | ICD-10-CM | POA: Diagnosis not present

## 2019-12-17 DIAGNOSIS — M545 Low back pain: Secondary | ICD-10-CM | POA: Diagnosis not present

## 2019-12-17 DIAGNOSIS — M542 Cervicalgia: Secondary | ICD-10-CM | POA: Diagnosis not present

## 2019-12-17 DIAGNOSIS — G603 Idiopathic progressive neuropathy: Secondary | ICD-10-CM | POA: Diagnosis not present

## 2019-12-23 ENCOUNTER — Other Ambulatory Visit: Payer: Self-pay | Admitting: Neurology

## 2019-12-23 ENCOUNTER — Other Ambulatory Visit (HOSPITAL_COMMUNITY): Payer: Self-pay | Admitting: Neurology

## 2019-12-23 DIAGNOSIS — M545 Low back pain, unspecified: Secondary | ICD-10-CM

## 2020-02-03 DIAGNOSIS — G40309 Generalized idiopathic epilepsy and epileptic syndromes, not intractable, without status epilepticus: Secondary | ICD-10-CM | POA: Diagnosis not present

## 2020-02-03 DIAGNOSIS — E440 Moderate protein-calorie malnutrition: Secondary | ICD-10-CM | POA: Diagnosis not present

## 2020-02-03 DIAGNOSIS — J449 Chronic obstructive pulmonary disease, unspecified: Secondary | ICD-10-CM | POA: Diagnosis not present

## 2020-02-03 DIAGNOSIS — R69 Illness, unspecified: Secondary | ICD-10-CM | POA: Diagnosis not present

## 2020-02-03 DIAGNOSIS — Z681 Body mass index (BMI) 19 or less, adult: Secondary | ICD-10-CM | POA: Diagnosis not present

## 2020-02-03 DIAGNOSIS — I10 Essential (primary) hypertension: Secondary | ICD-10-CM | POA: Diagnosis not present

## 2020-02-03 DIAGNOSIS — M545 Low back pain: Secondary | ICD-10-CM | POA: Diagnosis not present

## 2020-02-11 DIAGNOSIS — I739 Peripheral vascular disease, unspecified: Secondary | ICD-10-CM | POA: Diagnosis not present

## 2020-02-11 DIAGNOSIS — Z8249 Family history of ischemic heart disease and other diseases of the circulatory system: Secondary | ICD-10-CM | POA: Diagnosis not present

## 2020-03-07 DIAGNOSIS — E559 Vitamin D deficiency, unspecified: Secondary | ICD-10-CM | POA: Diagnosis not present

## 2020-03-07 DIAGNOSIS — M542 Cervicalgia: Secondary | ICD-10-CM | POA: Diagnosis not present

## 2020-03-07 DIAGNOSIS — G603 Idiopathic progressive neuropathy: Secondary | ICD-10-CM | POA: Diagnosis not present

## 2020-03-07 DIAGNOSIS — M545 Low back pain: Secondary | ICD-10-CM | POA: Diagnosis not present

## 2020-03-07 DIAGNOSIS — M130 Polyarthritis, unspecified: Secondary | ICD-10-CM | POA: Diagnosis not present

## 2020-03-07 DIAGNOSIS — Z79891 Long term (current) use of opiate analgesic: Secondary | ICD-10-CM | POA: Diagnosis not present

## 2020-03-07 DIAGNOSIS — G4700 Insomnia, unspecified: Secondary | ICD-10-CM | POA: Diagnosis not present

## 2020-03-16 ENCOUNTER — Other Ambulatory Visit (HOSPITAL_COMMUNITY): Payer: Self-pay | Admitting: Respiratory Therapy

## 2020-03-16 DIAGNOSIS — J449 Chronic obstructive pulmonary disease, unspecified: Secondary | ICD-10-CM

## 2020-04-29 ENCOUNTER — Other Ambulatory Visit (HOSPITAL_COMMUNITY)
Admission: RE | Admit: 2020-04-29 | Discharge: 2020-04-29 | Disposition: A | Discharge status: Home / Self Care | Payer: Medicare HMO | Source: Ambulatory Visit | Attending: Internal Medicine | Admitting: Internal Medicine

## 2020-04-29 ENCOUNTER — Other Ambulatory Visit: Payer: Self-pay

## 2020-04-29 NOTE — Progress Notes (Signed)
Pt was a no show. I tried to call pt and both times the recording said your call cannot be completed at this time.

## 2020-05-03 ENCOUNTER — Ambulatory Visit (HOSPITAL_COMMUNITY): Admission: RE | Admit: 2020-05-03 | Payer: Medicare HMO | Source: Ambulatory Visit

## 2020-05-13 IMAGING — DX DG LUMBAR SPINE COMPLETE 4+V
5 series · 5 of 5 positions shown · non-contrast
Comparison: None.

CLINICAL DATA: Chronic low back pain.  No known injury.

EXAM:
LUMBAR SPINE - COMPLETE 4+ VIEW

[l-spine ap]
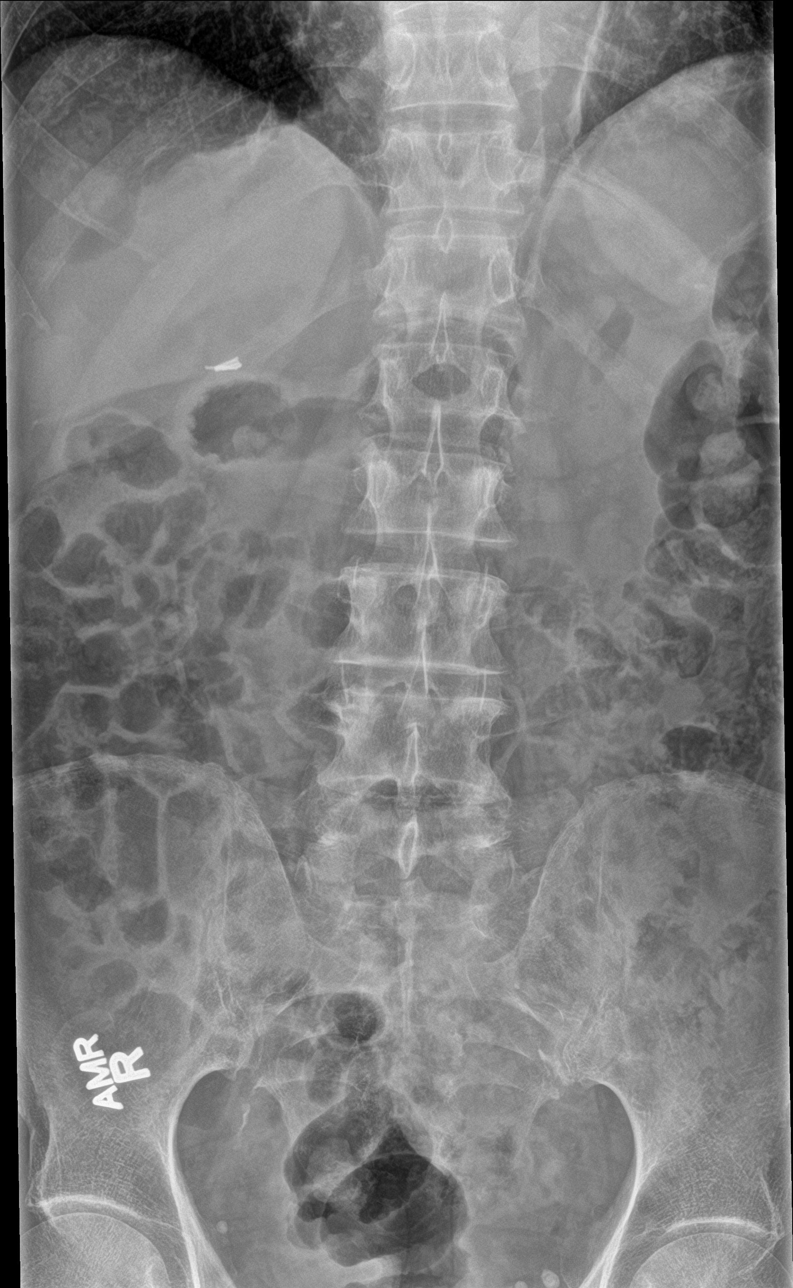

[l-spine obl (1 of 2)]
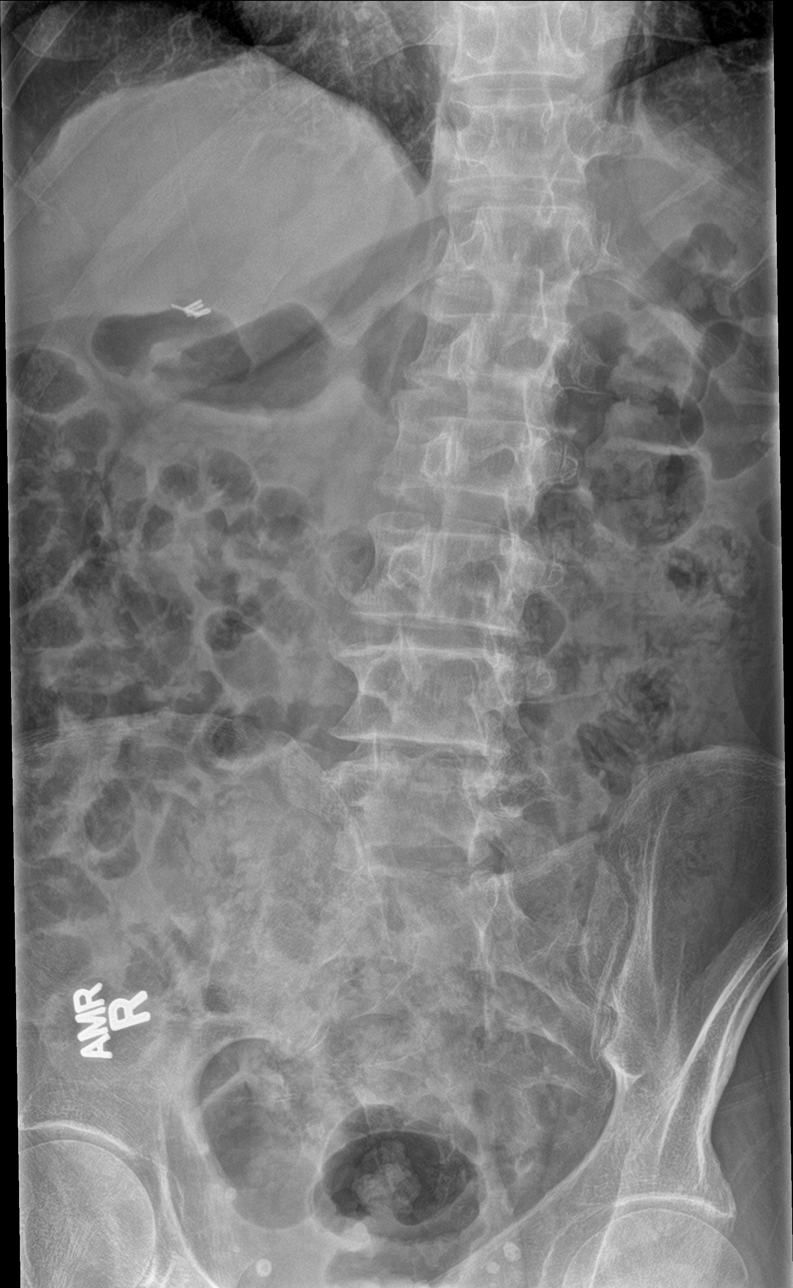

[l-spine obl (2 of 2)]
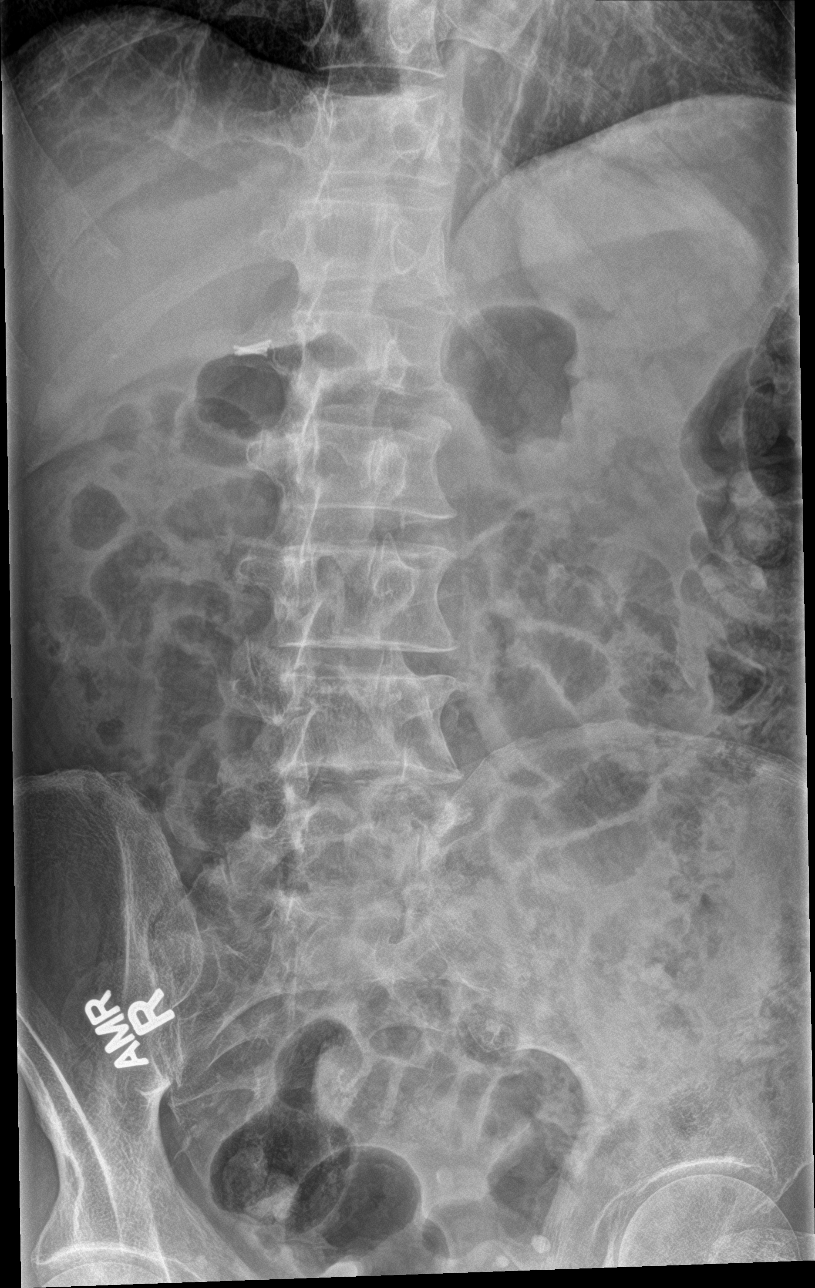

[l-spine lat]
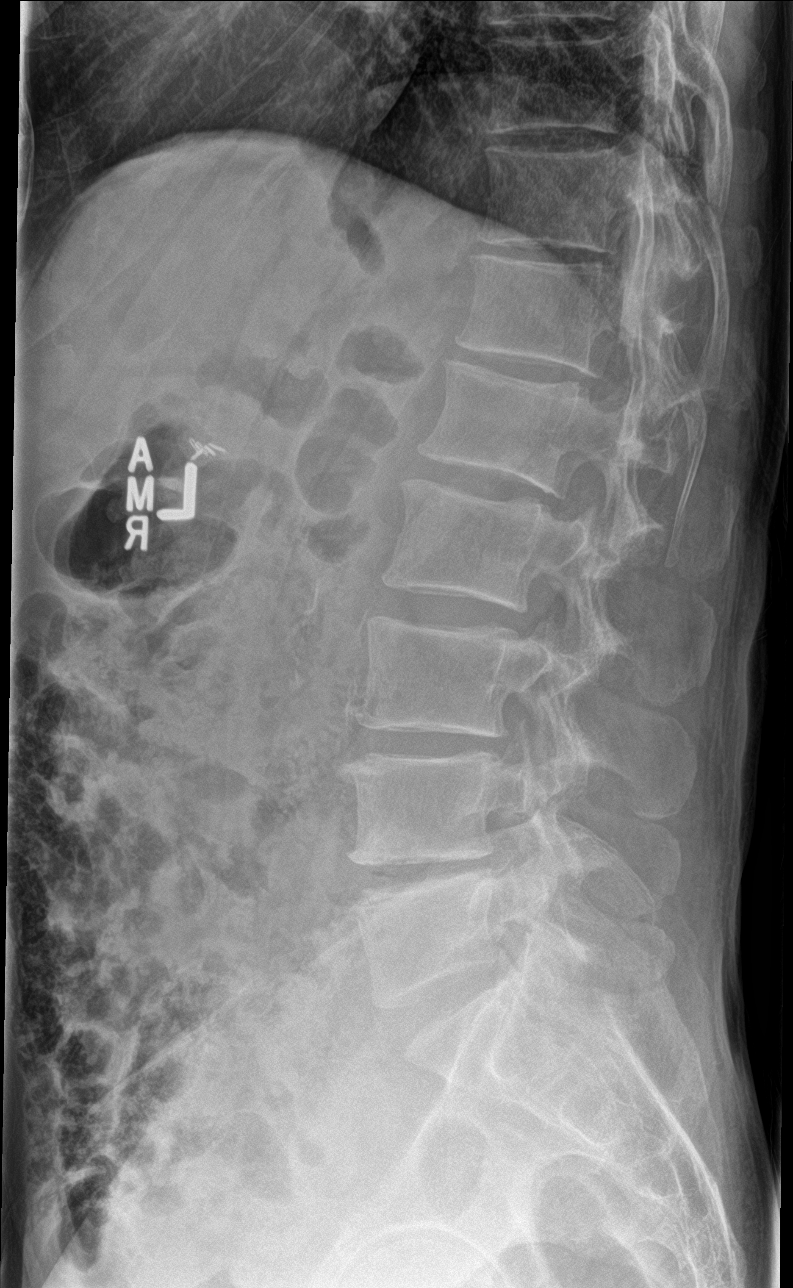

[l-spine spot]
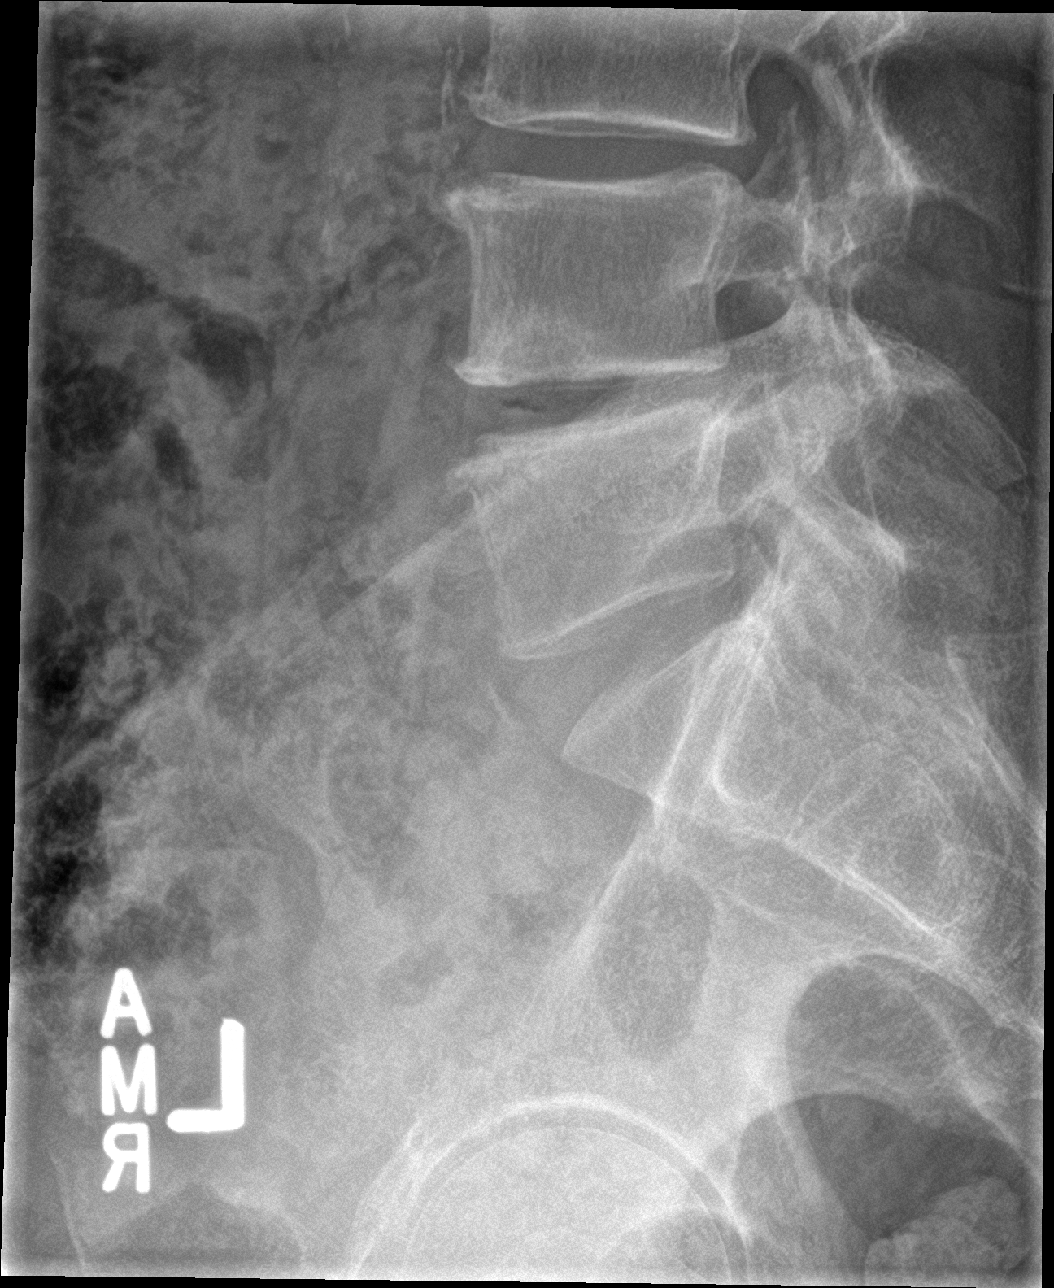

[5 of 5 positions shown; findings below may reference images not displayed]

FINDINGS: Vertebral body height and alignment are maintained. Mild loss of
disc space height and vacuum disc phenomenon are seen at L4-5.
Paraspinous structures demonstrate cholecystectomy clips. Aortic
atherosclerosis is noted.
IMPRESSION: Degenerative disc disease L4-5.

Atherosclerosis.

## 2020-06-08 DIAGNOSIS — M542 Cervicalgia: Secondary | ICD-10-CM | POA: Diagnosis not present

## 2020-06-08 DIAGNOSIS — G603 Idiopathic progressive neuropathy: Secondary | ICD-10-CM | POA: Diagnosis not present

## 2020-06-08 DIAGNOSIS — E559 Vitamin D deficiency, unspecified: Secondary | ICD-10-CM | POA: Diagnosis not present

## 2020-06-08 DIAGNOSIS — Z79891 Long term (current) use of opiate analgesic: Secondary | ICD-10-CM | POA: Diagnosis not present

## 2020-06-08 DIAGNOSIS — M545 Low back pain: Secondary | ICD-10-CM | POA: Diagnosis not present

## 2020-06-08 DIAGNOSIS — M130 Polyarthritis, unspecified: Secondary | ICD-10-CM | POA: Diagnosis not present

## 2020-06-08 DIAGNOSIS — G4700 Insomnia, unspecified: Secondary | ICD-10-CM | POA: Diagnosis not present

## 2020-07-05 DIAGNOSIS — Z79891 Long term (current) use of opiate analgesic: Secondary | ICD-10-CM | POA: Diagnosis not present

## 2020-07-05 DIAGNOSIS — M545 Low back pain: Secondary | ICD-10-CM | POA: Diagnosis not present

## 2020-07-05 DIAGNOSIS — M130 Polyarthritis, unspecified: Secondary | ICD-10-CM | POA: Diagnosis not present

## 2020-07-05 DIAGNOSIS — G4700 Insomnia, unspecified: Secondary | ICD-10-CM | POA: Diagnosis not present

## 2020-07-05 DIAGNOSIS — G603 Idiopathic progressive neuropathy: Secondary | ICD-10-CM | POA: Diagnosis not present

## 2020-07-05 DIAGNOSIS — E559 Vitamin D deficiency, unspecified: Secondary | ICD-10-CM | POA: Diagnosis not present

## 2020-07-05 DIAGNOSIS — M542 Cervicalgia: Secondary | ICD-10-CM | POA: Diagnosis not present

## 2020-07-13 DIAGNOSIS — M545 Low back pain: Secondary | ICD-10-CM | POA: Diagnosis not present

## 2020-07-13 DIAGNOSIS — G40309 Generalized idiopathic epilepsy and epileptic syndromes, not intractable, without status epilepticus: Secondary | ICD-10-CM | POA: Diagnosis not present

## 2020-07-13 DIAGNOSIS — I10 Essential (primary) hypertension: Secondary | ICD-10-CM | POA: Diagnosis not present

## 2020-07-13 DIAGNOSIS — Z0000 Encounter for general adult medical examination without abnormal findings: Secondary | ICD-10-CM | POA: Diagnosis not present

## 2020-07-13 DIAGNOSIS — J449 Chronic obstructive pulmonary disease, unspecified: Secondary | ICD-10-CM | POA: Diagnosis not present

## 2020-07-13 DIAGNOSIS — Z125 Encounter for screening for malignant neoplasm of prostate: Secondary | ICD-10-CM | POA: Diagnosis not present

## 2020-07-13 DIAGNOSIS — Z1331 Encounter for screening for depression: Secondary | ICD-10-CM | POA: Diagnosis not present

## 2020-07-13 DIAGNOSIS — R69 Illness, unspecified: Secondary | ICD-10-CM | POA: Diagnosis not present

## 2020-07-13 DIAGNOSIS — Z681 Body mass index (BMI) 19 or less, adult: Secondary | ICD-10-CM | POA: Diagnosis not present

## 2020-07-13 DIAGNOSIS — E440 Moderate protein-calorie malnutrition: Secondary | ICD-10-CM | POA: Diagnosis not present

## 2020-08-03 DIAGNOSIS — M545 Low back pain: Secondary | ICD-10-CM | POA: Diagnosis not present

## 2020-08-03 DIAGNOSIS — E559 Vitamin D deficiency, unspecified: Secondary | ICD-10-CM | POA: Diagnosis not present

## 2020-08-03 DIAGNOSIS — G603 Idiopathic progressive neuropathy: Secondary | ICD-10-CM | POA: Diagnosis not present

## 2020-08-03 DIAGNOSIS — M130 Polyarthritis, unspecified: Secondary | ICD-10-CM | POA: Diagnosis not present

## 2020-08-03 DIAGNOSIS — Z79891 Long term (current) use of opiate analgesic: Secondary | ICD-10-CM | POA: Diagnosis not present

## 2020-08-03 DIAGNOSIS — G4700 Insomnia, unspecified: Secondary | ICD-10-CM | POA: Diagnosis not present

## 2020-08-03 DIAGNOSIS — M542 Cervicalgia: Secondary | ICD-10-CM | POA: Diagnosis not present

## 2020-10-25 DIAGNOSIS — G4700 Insomnia, unspecified: Secondary | ICD-10-CM | POA: Diagnosis not present

## 2020-10-25 DIAGNOSIS — G603 Idiopathic progressive neuropathy: Secondary | ICD-10-CM | POA: Diagnosis not present

## 2020-10-25 DIAGNOSIS — M130 Polyarthritis, unspecified: Secondary | ICD-10-CM | POA: Diagnosis not present

## 2020-10-25 DIAGNOSIS — M545 Low back pain, unspecified: Secondary | ICD-10-CM | POA: Diagnosis not present

## 2020-10-25 DIAGNOSIS — Z79891 Long term (current) use of opiate analgesic: Secondary | ICD-10-CM | POA: Diagnosis not present

## 2020-10-25 DIAGNOSIS — M542 Cervicalgia: Secondary | ICD-10-CM | POA: Diagnosis not present

## 2020-10-25 DIAGNOSIS — E559 Vitamin D deficiency, unspecified: Secondary | ICD-10-CM | POA: Diagnosis not present

## 2020-11-23 DIAGNOSIS — I10 Essential (primary) hypertension: Secondary | ICD-10-CM | POA: Diagnosis not present

## 2020-11-23 DIAGNOSIS — M545 Low back pain, unspecified: Secondary | ICD-10-CM | POA: Diagnosis not present

## 2020-11-23 DIAGNOSIS — Z681 Body mass index (BMI) 19 or less, adult: Secondary | ICD-10-CM | POA: Diagnosis not present

## 2020-11-23 DIAGNOSIS — J310 Chronic rhinitis: Secondary | ICD-10-CM | POA: Diagnosis not present

## 2020-11-23 DIAGNOSIS — G40309 Generalized idiopathic epilepsy and epileptic syndromes, not intractable, without status epilepticus: Secondary | ICD-10-CM | POA: Diagnosis not present

## 2020-11-23 DIAGNOSIS — E440 Moderate protein-calorie malnutrition: Secondary | ICD-10-CM | POA: Diagnosis not present

## 2020-11-23 DIAGNOSIS — R69 Illness, unspecified: Secondary | ICD-10-CM | POA: Diagnosis not present

## 2020-12-21 DIAGNOSIS — M130 Polyarthritis, unspecified: Secondary | ICD-10-CM | POA: Diagnosis not present

## 2020-12-21 DIAGNOSIS — M542 Cervicalgia: Secondary | ICD-10-CM | POA: Diagnosis not present

## 2020-12-21 DIAGNOSIS — G4700 Insomnia, unspecified: Secondary | ICD-10-CM | POA: Diagnosis not present

## 2020-12-21 DIAGNOSIS — E559 Vitamin D deficiency, unspecified: Secondary | ICD-10-CM | POA: Diagnosis not present

## 2020-12-21 DIAGNOSIS — M545 Low back pain, unspecified: Secondary | ICD-10-CM | POA: Diagnosis not present

## 2020-12-21 DIAGNOSIS — Z79891 Long term (current) use of opiate analgesic: Secondary | ICD-10-CM | POA: Diagnosis not present

## 2020-12-21 DIAGNOSIS — G603 Idiopathic progressive neuropathy: Secondary | ICD-10-CM | POA: Diagnosis not present

## 2021-01-30 DIAGNOSIS — G40309 Generalized idiopathic epilepsy and epileptic syndromes, not intractable, without status epilepticus: Secondary | ICD-10-CM | POA: Diagnosis not present

## 2021-01-30 DIAGNOSIS — I10 Essential (primary) hypertension: Secondary | ICD-10-CM | POA: Diagnosis not present

## 2021-01-30 DIAGNOSIS — M545 Low back pain, unspecified: Secondary | ICD-10-CM | POA: Diagnosis not present

## 2021-02-22 DIAGNOSIS — G4700 Insomnia, unspecified: Secondary | ICD-10-CM | POA: Diagnosis not present

## 2021-02-22 DIAGNOSIS — E559 Vitamin D deficiency, unspecified: Secondary | ICD-10-CM | POA: Diagnosis not present

## 2021-02-22 DIAGNOSIS — M545 Low back pain, unspecified: Secondary | ICD-10-CM | POA: Diagnosis not present

## 2021-02-22 DIAGNOSIS — M130 Polyarthritis, unspecified: Secondary | ICD-10-CM | POA: Diagnosis not present

## 2021-02-22 DIAGNOSIS — G603 Idiopathic progressive neuropathy: Secondary | ICD-10-CM | POA: Diagnosis not present

## 2021-02-22 DIAGNOSIS — Z79891 Long term (current) use of opiate analgesic: Secondary | ICD-10-CM | POA: Diagnosis not present

## 2021-02-22 DIAGNOSIS — M542 Cervicalgia: Secondary | ICD-10-CM | POA: Diagnosis not present

## 2021-03-06 DIAGNOSIS — I10 Essential (primary) hypertension: Secondary | ICD-10-CM | POA: Diagnosis not present

## 2021-03-06 DIAGNOSIS — R69 Illness, unspecified: Secondary | ICD-10-CM | POA: Diagnosis not present

## 2021-03-06 DIAGNOSIS — M545 Low back pain, unspecified: Secondary | ICD-10-CM | POA: Diagnosis not present

## 2021-03-06 DIAGNOSIS — G40309 Generalized idiopathic epilepsy and epileptic syndromes, not intractable, without status epilepticus: Secondary | ICD-10-CM | POA: Diagnosis not present

## 2021-03-06 DIAGNOSIS — E440 Moderate protein-calorie malnutrition: Secondary | ICD-10-CM | POA: Diagnosis not present

## 2021-03-06 DIAGNOSIS — J310 Chronic rhinitis: Secondary | ICD-10-CM | POA: Diagnosis not present

## 2021-03-06 DIAGNOSIS — Z681 Body mass index (BMI) 19 or less, adult: Secondary | ICD-10-CM | POA: Diagnosis not present

## 2021-05-30 DIAGNOSIS — M542 Cervicalgia: Secondary | ICD-10-CM | POA: Diagnosis not present

## 2021-05-30 DIAGNOSIS — M130 Polyarthritis, unspecified: Secondary | ICD-10-CM | POA: Diagnosis not present

## 2021-05-30 DIAGNOSIS — E559 Vitamin D deficiency, unspecified: Secondary | ICD-10-CM | POA: Diagnosis not present

## 2021-05-30 DIAGNOSIS — M545 Low back pain, unspecified: Secondary | ICD-10-CM | POA: Diagnosis not present

## 2021-05-30 DIAGNOSIS — G4700 Insomnia, unspecified: Secondary | ICD-10-CM | POA: Diagnosis not present

## 2021-05-30 DIAGNOSIS — G603 Idiopathic progressive neuropathy: Secondary | ICD-10-CM | POA: Diagnosis not present

## 2021-05-30 DIAGNOSIS — Z79891 Long term (current) use of opiate analgesic: Secondary | ICD-10-CM | POA: Diagnosis not present

## 2021-07-28 DIAGNOSIS — J309 Allergic rhinitis, unspecified: Secondary | ICD-10-CM | POA: Diagnosis not present

## 2021-07-28 DIAGNOSIS — M130 Polyarthritis, unspecified: Secondary | ICD-10-CM | POA: Diagnosis not present

## 2021-07-28 DIAGNOSIS — M545 Low back pain, unspecified: Secondary | ICD-10-CM | POA: Diagnosis not present

## 2021-07-28 DIAGNOSIS — Z79891 Long term (current) use of opiate analgesic: Secondary | ICD-10-CM | POA: Diagnosis not present

## 2021-07-28 DIAGNOSIS — M542 Cervicalgia: Secondary | ICD-10-CM | POA: Diagnosis not present

## 2021-07-28 DIAGNOSIS — G603 Idiopathic progressive neuropathy: Secondary | ICD-10-CM | POA: Diagnosis not present

## 2021-07-28 DIAGNOSIS — G4700 Insomnia, unspecified: Secondary | ICD-10-CM | POA: Diagnosis not present

## 2021-07-28 DIAGNOSIS — E559 Vitamin D deficiency, unspecified: Secondary | ICD-10-CM | POA: Diagnosis not present

## 2021-08-16 DIAGNOSIS — M545 Low back pain, unspecified: Secondary | ICD-10-CM | POA: Diagnosis not present

## 2021-08-16 DIAGNOSIS — R69 Illness, unspecified: Secondary | ICD-10-CM | POA: Diagnosis not present

## 2021-08-16 DIAGNOSIS — Z681 Body mass index (BMI) 19 or less, adult: Secondary | ICD-10-CM | POA: Diagnosis not present

## 2021-08-16 DIAGNOSIS — Z1331 Encounter for screening for depression: Secondary | ICD-10-CM | POA: Diagnosis not present

## 2021-08-16 DIAGNOSIS — G40309 Generalized idiopathic epilepsy and epileptic syndromes, not intractable, without status epilepticus: Secondary | ICD-10-CM | POA: Diagnosis not present

## 2021-08-16 DIAGNOSIS — J310 Chronic rhinitis: Secondary | ICD-10-CM | POA: Diagnosis not present

## 2021-08-16 DIAGNOSIS — I10 Essential (primary) hypertension: Secondary | ICD-10-CM | POA: Diagnosis not present

## 2021-08-16 DIAGNOSIS — E440 Moderate protein-calorie malnutrition: Secondary | ICD-10-CM | POA: Diagnosis not present

## 2021-08-16 DIAGNOSIS — Z0000 Encounter for general adult medical examination without abnormal findings: Secondary | ICD-10-CM | POA: Diagnosis not present

## 2021-11-15 DIAGNOSIS — Z0000 Encounter for general adult medical examination without abnormal findings: Secondary | ICD-10-CM | POA: Diagnosis not present

## 2021-11-15 DIAGNOSIS — I10 Essential (primary) hypertension: Secondary | ICD-10-CM | POA: Diagnosis not present

## 2021-11-15 DIAGNOSIS — G40309 Generalized idiopathic epilepsy and epileptic syndromes, not intractable, without status epilepticus: Secondary | ICD-10-CM | POA: Diagnosis not present

## 2021-11-15 DIAGNOSIS — Z1331 Encounter for screening for depression: Secondary | ICD-10-CM | POA: Diagnosis not present

## 2021-11-15 DIAGNOSIS — E440 Moderate protein-calorie malnutrition: Secondary | ICD-10-CM | POA: Diagnosis not present

## 2021-11-15 DIAGNOSIS — Z125 Encounter for screening for malignant neoplasm of prostate: Secondary | ICD-10-CM | POA: Diagnosis not present

## 2021-11-15 DIAGNOSIS — R69 Illness, unspecified: Secondary | ICD-10-CM | POA: Diagnosis not present

## 2021-11-15 DIAGNOSIS — J310 Chronic rhinitis: Secondary | ICD-10-CM | POA: Diagnosis not present

## 2021-11-15 DIAGNOSIS — M545 Low back pain, unspecified: Secondary | ICD-10-CM | POA: Diagnosis not present

## 2021-11-15 DIAGNOSIS — F338 Other recurrent depressive disorders: Secondary | ICD-10-CM | POA: Diagnosis not present

## 2021-11-15 DIAGNOSIS — Z681 Body mass index (BMI) 19 or less, adult: Secondary | ICD-10-CM | POA: Diagnosis not present

## 2021-12-01 DIAGNOSIS — M542 Cervicalgia: Secondary | ICD-10-CM | POA: Diagnosis not present

## 2021-12-01 DIAGNOSIS — J309 Allergic rhinitis, unspecified: Secondary | ICD-10-CM | POA: Diagnosis not present

## 2021-12-01 DIAGNOSIS — M545 Low back pain, unspecified: Secondary | ICD-10-CM | POA: Diagnosis not present

## 2021-12-01 DIAGNOSIS — Z79891 Long term (current) use of opiate analgesic: Secondary | ICD-10-CM | POA: Diagnosis not present

## 2021-12-01 DIAGNOSIS — G603 Idiopathic progressive neuropathy: Secondary | ICD-10-CM | POA: Diagnosis not present

## 2021-12-01 DIAGNOSIS — E559 Vitamin D deficiency, unspecified: Secondary | ICD-10-CM | POA: Diagnosis not present

## 2021-12-01 DIAGNOSIS — G4700 Insomnia, unspecified: Secondary | ICD-10-CM | POA: Diagnosis not present

## 2021-12-01 DIAGNOSIS — M130 Polyarthritis, unspecified: Secondary | ICD-10-CM | POA: Diagnosis not present

## 2022-02-20 DIAGNOSIS — R69 Illness, unspecified: Secondary | ICD-10-CM | POA: Diagnosis not present

## 2022-02-20 DIAGNOSIS — G40309 Generalized idiopathic epilepsy and epileptic syndromes, not intractable, without status epilepticus: Secondary | ICD-10-CM | POA: Diagnosis not present

## 2022-02-20 DIAGNOSIS — I10 Essential (primary) hypertension: Secondary | ICD-10-CM | POA: Diagnosis not present

## 2022-02-20 DIAGNOSIS — Z681 Body mass index (BMI) 19 or less, adult: Secondary | ICD-10-CM | POA: Diagnosis not present

## 2022-02-20 DIAGNOSIS — M545 Low back pain, unspecified: Secondary | ICD-10-CM | POA: Diagnosis not present

## 2022-02-20 DIAGNOSIS — J0191 Acute recurrent sinusitis, unspecified: Secondary | ICD-10-CM | POA: Diagnosis not present

## 2022-02-20 DIAGNOSIS — E440 Moderate protein-calorie malnutrition: Secondary | ICD-10-CM | POA: Diagnosis not present

## 2022-05-11 DIAGNOSIS — G603 Idiopathic progressive neuropathy: Secondary | ICD-10-CM | POA: Diagnosis not present

## 2022-05-11 DIAGNOSIS — M130 Polyarthritis, unspecified: Secondary | ICD-10-CM | POA: Diagnosis not present

## 2022-05-11 DIAGNOSIS — Z79891 Long term (current) use of opiate analgesic: Secondary | ICD-10-CM | POA: Diagnosis not present

## 2022-05-11 DIAGNOSIS — M545 Low back pain, unspecified: Secondary | ICD-10-CM | POA: Diagnosis not present

## 2022-05-11 DIAGNOSIS — M542 Cervicalgia: Secondary | ICD-10-CM | POA: Diagnosis not present

## 2022-05-23 DIAGNOSIS — F338 Other recurrent depressive disorders: Secondary | ICD-10-CM | POA: Diagnosis not present

## 2022-05-23 DIAGNOSIS — M545 Low back pain, unspecified: Secondary | ICD-10-CM | POA: Diagnosis not present

## 2022-05-23 DIAGNOSIS — J309 Allergic rhinitis, unspecified: Secondary | ICD-10-CM | POA: Diagnosis not present

## 2022-05-23 DIAGNOSIS — R69 Illness, unspecified: Secondary | ICD-10-CM | POA: Diagnosis not present

## 2022-05-23 DIAGNOSIS — Z1159 Encounter for screening for other viral diseases: Secondary | ICD-10-CM | POA: Diagnosis not present

## 2022-05-23 DIAGNOSIS — G40309 Generalized idiopathic epilepsy and epileptic syndromes, not intractable, without status epilepticus: Secondary | ICD-10-CM | POA: Diagnosis not present

## 2022-05-23 DIAGNOSIS — J449 Chronic obstructive pulmonary disease, unspecified: Secondary | ICD-10-CM | POA: Diagnosis not present

## 2022-05-23 DIAGNOSIS — E440 Moderate protein-calorie malnutrition: Secondary | ICD-10-CM | POA: Diagnosis not present

## 2022-05-23 DIAGNOSIS — Z0000 Encounter for general adult medical examination without abnormal findings: Secondary | ICD-10-CM | POA: Diagnosis not present

## 2022-05-23 DIAGNOSIS — I10 Essential (primary) hypertension: Secondary | ICD-10-CM | POA: Diagnosis not present

## 2022-05-23 DIAGNOSIS — J0191 Acute recurrent sinusitis, unspecified: Secondary | ICD-10-CM | POA: Diagnosis not present

## 2022-05-23 DIAGNOSIS — Z681 Body mass index (BMI) 19 or less, adult: Secondary | ICD-10-CM | POA: Diagnosis not present

## 2022-07-09 DIAGNOSIS — H353191 Nonexudative age-related macular degeneration, unspecified eye, early dry stage: Secondary | ICD-10-CM | POA: Diagnosis not present

## 2022-08-29 DIAGNOSIS — Z0000 Encounter for general adult medical examination without abnormal findings: Secondary | ICD-10-CM | POA: Diagnosis not present

## 2022-08-29 DIAGNOSIS — R69 Illness, unspecified: Secondary | ICD-10-CM | POA: Diagnosis not present

## 2022-08-29 DIAGNOSIS — I10 Essential (primary) hypertension: Secondary | ICD-10-CM | POA: Diagnosis not present

## 2022-08-29 DIAGNOSIS — J449 Chronic obstructive pulmonary disease, unspecified: Secondary | ICD-10-CM | POA: Diagnosis not present

## 2022-08-29 DIAGNOSIS — E440 Moderate protein-calorie malnutrition: Secondary | ICD-10-CM | POA: Diagnosis not present

## 2022-08-29 DIAGNOSIS — M545 Low back pain, unspecified: Secondary | ICD-10-CM | POA: Diagnosis not present

## 2022-08-29 DIAGNOSIS — G40309 Generalized idiopathic epilepsy and epileptic syndromes, not intractable, without status epilepticus: Secondary | ICD-10-CM | POA: Diagnosis not present

## 2022-08-29 DIAGNOSIS — Z681 Body mass index (BMI) 19 or less, adult: Secondary | ICD-10-CM | POA: Diagnosis not present

## 2022-08-29 DIAGNOSIS — J309 Allergic rhinitis, unspecified: Secondary | ICD-10-CM | POA: Diagnosis not present

## 2022-12-04 DIAGNOSIS — F330 Major depressive disorder, recurrent, mild: Secondary | ICD-10-CM | POA: Diagnosis not present

## 2022-12-04 DIAGNOSIS — M545 Low back pain, unspecified: Secondary | ICD-10-CM | POA: Diagnosis not present

## 2022-12-04 DIAGNOSIS — Z125 Encounter for screening for malignant neoplasm of prostate: Secondary | ICD-10-CM | POA: Diagnosis not present

## 2022-12-04 DIAGNOSIS — J309 Allergic rhinitis, unspecified: Secondary | ICD-10-CM | POA: Diagnosis not present

## 2022-12-04 DIAGNOSIS — R69 Illness, unspecified: Secondary | ICD-10-CM | POA: Diagnosis not present

## 2022-12-04 DIAGNOSIS — Z681 Body mass index (BMI) 19 or less, adult: Secondary | ICD-10-CM | POA: Diagnosis not present

## 2022-12-04 DIAGNOSIS — J449 Chronic obstructive pulmonary disease, unspecified: Secondary | ICD-10-CM | POA: Diagnosis not present

## 2022-12-04 DIAGNOSIS — I10 Essential (primary) hypertension: Secondary | ICD-10-CM | POA: Diagnosis not present

## 2022-12-04 DIAGNOSIS — Z0000 Encounter for general adult medical examination without abnormal findings: Secondary | ICD-10-CM | POA: Diagnosis not present

## 2022-12-04 DIAGNOSIS — G40309 Generalized idiopathic epilepsy and epileptic syndromes, not intractable, without status epilepticus: Secondary | ICD-10-CM | POA: Diagnosis not present

## 2022-12-04 DIAGNOSIS — E440 Moderate protein-calorie malnutrition: Secondary | ICD-10-CM | POA: Diagnosis not present

## 2023-02-11 DIAGNOSIS — J309 Allergic rhinitis, unspecified: Secondary | ICD-10-CM | POA: Diagnosis not present

## 2023-02-11 DIAGNOSIS — Z0000 Encounter for general adult medical examination without abnormal findings: Secondary | ICD-10-CM | POA: Diagnosis not present

## 2023-02-11 DIAGNOSIS — I739 Peripheral vascular disease, unspecified: Secondary | ICD-10-CM | POA: Diagnosis not present

## 2023-02-11 DIAGNOSIS — J449 Chronic obstructive pulmonary disease, unspecified: Secondary | ICD-10-CM | POA: Diagnosis not present

## 2023-02-11 DIAGNOSIS — I700 Atherosclerosis of aorta: Secondary | ICD-10-CM | POA: Diagnosis not present

## 2023-02-11 DIAGNOSIS — R69 Illness, unspecified: Secondary | ICD-10-CM | POA: Diagnosis not present

## 2023-02-11 DIAGNOSIS — E440 Moderate protein-calorie malnutrition: Secondary | ICD-10-CM | POA: Diagnosis not present

## 2023-02-11 DIAGNOSIS — M545 Low back pain, unspecified: Secondary | ICD-10-CM | POA: Diagnosis not present

## 2023-02-11 DIAGNOSIS — Z681 Body mass index (BMI) 19 or less, adult: Secondary | ICD-10-CM | POA: Diagnosis not present

## 2023-02-11 DIAGNOSIS — G40309 Generalized idiopathic epilepsy and epileptic syndromes, not intractable, without status epilepticus: Secondary | ICD-10-CM | POA: Diagnosis not present

## 2023-02-11 DIAGNOSIS — I10 Essential (primary) hypertension: Secondary | ICD-10-CM | POA: Diagnosis not present

## 2023-02-21 DIAGNOSIS — I739 Peripheral vascular disease, unspecified: Secondary | ICD-10-CM | POA: Diagnosis not present

## 2023-05-13 DIAGNOSIS — I739 Peripheral vascular disease, unspecified: Secondary | ICD-10-CM | POA: Diagnosis not present

## 2023-05-13 DIAGNOSIS — L282 Other prurigo: Secondary | ICD-10-CM | POA: Diagnosis not present

## 2023-05-13 DIAGNOSIS — J449 Chronic obstructive pulmonary disease, unspecified: Secondary | ICD-10-CM | POA: Diagnosis not present

## 2023-05-13 DIAGNOSIS — G603 Idiopathic progressive neuropathy: Secondary | ICD-10-CM | POA: Diagnosis not present

## 2023-05-13 DIAGNOSIS — F330 Major depressive disorder, recurrent, mild: Secondary | ICD-10-CM | POA: Diagnosis not present

## 2023-05-13 DIAGNOSIS — Z0000 Encounter for general adult medical examination without abnormal findings: Secondary | ICD-10-CM | POA: Diagnosis not present

## 2023-05-13 DIAGNOSIS — M545 Low back pain, unspecified: Secondary | ICD-10-CM | POA: Diagnosis not present

## 2023-05-13 DIAGNOSIS — E440 Moderate protein-calorie malnutrition: Secondary | ICD-10-CM | POA: Diagnosis not present

## 2023-05-13 DIAGNOSIS — G40309 Generalized idiopathic epilepsy and epileptic syndromes, not intractable, without status epilepticus: Secondary | ICD-10-CM | POA: Diagnosis not present

## 2023-05-13 DIAGNOSIS — Z681 Body mass index (BMI) 19 or less, adult: Secondary | ICD-10-CM | POA: Diagnosis not present

## 2023-05-13 DIAGNOSIS — I10 Essential (primary) hypertension: Secondary | ICD-10-CM | POA: Diagnosis not present

## 2023-05-13 DIAGNOSIS — I700 Atherosclerosis of aorta: Secondary | ICD-10-CM | POA: Diagnosis not present

## 2023-05-13 DIAGNOSIS — J309 Allergic rhinitis, unspecified: Secondary | ICD-10-CM | POA: Diagnosis not present

## 2023-08-12 DIAGNOSIS — E440 Moderate protein-calorie malnutrition: Secondary | ICD-10-CM | POA: Diagnosis not present

## 2023-08-12 DIAGNOSIS — F330 Major depressive disorder, recurrent, mild: Secondary | ICD-10-CM | POA: Diagnosis not present

## 2023-08-12 DIAGNOSIS — I739 Peripheral vascular disease, unspecified: Secondary | ICD-10-CM | POA: Diagnosis not present

## 2023-08-12 DIAGNOSIS — I10 Essential (primary) hypertension: Secondary | ICD-10-CM | POA: Diagnosis not present

## 2023-08-12 DIAGNOSIS — Z681 Body mass index (BMI) 19 or less, adult: Secondary | ICD-10-CM | POA: Diagnosis not present

## 2023-08-12 DIAGNOSIS — L282 Other prurigo: Secondary | ICD-10-CM | POA: Diagnosis not present

## 2023-08-12 DIAGNOSIS — M545 Low back pain, unspecified: Secondary | ICD-10-CM | POA: Diagnosis not present

## 2023-08-12 DIAGNOSIS — J309 Allergic rhinitis, unspecified: Secondary | ICD-10-CM | POA: Diagnosis not present

## 2023-08-12 DIAGNOSIS — G40309 Generalized idiopathic epilepsy and epileptic syndromes, not intractable, without status epilepticus: Secondary | ICD-10-CM | POA: Diagnosis not present

## 2023-08-12 DIAGNOSIS — G603 Idiopathic progressive neuropathy: Secondary | ICD-10-CM | POA: Diagnosis not present

## 2023-08-12 DIAGNOSIS — I700 Atherosclerosis of aorta: Secondary | ICD-10-CM | POA: Diagnosis not present

## 2023-08-12 DIAGNOSIS — J449 Chronic obstructive pulmonary disease, unspecified: Secondary | ICD-10-CM | POA: Diagnosis not present

## 2023-11-11 DIAGNOSIS — Z681 Body mass index (BMI) 19 or less, adult: Secondary | ICD-10-CM | POA: Diagnosis not present

## 2023-11-11 DIAGNOSIS — G40309 Generalized idiopathic epilepsy and epileptic syndromes, not intractable, without status epilepticus: Secondary | ICD-10-CM | POA: Diagnosis not present

## 2023-11-11 DIAGNOSIS — I10 Essential (primary) hypertension: Secondary | ICD-10-CM | POA: Diagnosis not present

## 2023-11-11 DIAGNOSIS — F330 Major depressive disorder, recurrent, mild: Secondary | ICD-10-CM | POA: Diagnosis not present

## 2023-11-11 DIAGNOSIS — G603 Idiopathic progressive neuropathy: Secondary | ICD-10-CM | POA: Diagnosis not present

## 2023-11-11 DIAGNOSIS — L309 Dermatitis, unspecified: Secondary | ICD-10-CM | POA: Diagnosis not present

## 2023-11-11 DIAGNOSIS — I739 Peripheral vascular disease, unspecified: Secondary | ICD-10-CM | POA: Diagnosis not present

## 2023-11-11 DIAGNOSIS — J309 Allergic rhinitis, unspecified: Secondary | ICD-10-CM | POA: Diagnosis not present

## 2023-11-11 DIAGNOSIS — J449 Chronic obstructive pulmonary disease, unspecified: Secondary | ICD-10-CM | POA: Diagnosis not present

## 2023-11-11 DIAGNOSIS — M545 Low back pain, unspecified: Secondary | ICD-10-CM | POA: Diagnosis not present

## 2023-11-11 DIAGNOSIS — L282 Other prurigo: Secondary | ICD-10-CM | POA: Diagnosis not present

## 2023-11-11 DIAGNOSIS — I700 Atherosclerosis of aorta: Secondary | ICD-10-CM | POA: Diagnosis not present

## 2024-02-10 DIAGNOSIS — I700 Atherosclerosis of aorta: Secondary | ICD-10-CM | POA: Diagnosis not present

## 2024-02-10 DIAGNOSIS — Z0000 Encounter for general adult medical examination without abnormal findings: Secondary | ICD-10-CM | POA: Diagnosis not present

## 2024-02-10 DIAGNOSIS — Z681 Body mass index (BMI) 19 or less, adult: Secondary | ICD-10-CM | POA: Diagnosis not present

## 2024-02-10 DIAGNOSIS — F330 Major depressive disorder, recurrent, mild: Secondary | ICD-10-CM | POA: Diagnosis not present

## 2024-02-10 DIAGNOSIS — M545 Low back pain, unspecified: Secondary | ICD-10-CM | POA: Diagnosis not present

## 2024-02-10 DIAGNOSIS — I739 Peripheral vascular disease, unspecified: Secondary | ICD-10-CM | POA: Diagnosis not present

## 2024-02-10 DIAGNOSIS — G603 Idiopathic progressive neuropathy: Secondary | ICD-10-CM | POA: Diagnosis not present

## 2024-02-10 DIAGNOSIS — J309 Allergic rhinitis, unspecified: Secondary | ICD-10-CM | POA: Diagnosis not present

## 2024-02-10 DIAGNOSIS — I10 Essential (primary) hypertension: Secondary | ICD-10-CM | POA: Diagnosis not present

## 2024-02-10 DIAGNOSIS — G40309 Generalized idiopathic epilepsy and epileptic syndromes, not intractable, without status epilepticus: Secondary | ICD-10-CM | POA: Diagnosis not present

## 2024-02-10 DIAGNOSIS — E440 Moderate protein-calorie malnutrition: Secondary | ICD-10-CM | POA: Diagnosis not present

## 2024-02-10 DIAGNOSIS — J449 Chronic obstructive pulmonary disease, unspecified: Secondary | ICD-10-CM | POA: Diagnosis not present

## 2024-02-10 DIAGNOSIS — Z125 Encounter for screening for malignant neoplasm of prostate: Secondary | ICD-10-CM | POA: Diagnosis not present

## 2024-05-11 DIAGNOSIS — I700 Atherosclerosis of aorta: Secondary | ICD-10-CM | POA: Diagnosis not present

## 2024-05-11 DIAGNOSIS — G40309 Generalized idiopathic epilepsy and epileptic syndromes, not intractable, without status epilepticus: Secondary | ICD-10-CM | POA: Diagnosis not present

## 2024-05-11 DIAGNOSIS — I10 Essential (primary) hypertension: Secondary | ICD-10-CM | POA: Diagnosis not present

## 2024-05-11 DIAGNOSIS — Z681 Body mass index (BMI) 19 or less, adult: Secondary | ICD-10-CM | POA: Diagnosis not present

## 2024-05-11 DIAGNOSIS — Z0000 Encounter for general adult medical examination without abnormal findings: Secondary | ICD-10-CM | POA: Diagnosis not present

## 2024-05-11 DIAGNOSIS — N182 Chronic kidney disease, stage 2 (mild): Secondary | ICD-10-CM | POA: Diagnosis not present

## 2024-05-11 DIAGNOSIS — G603 Idiopathic progressive neuropathy: Secondary | ICD-10-CM | POA: Diagnosis not present

## 2024-05-11 DIAGNOSIS — F330 Major depressive disorder, recurrent, mild: Secondary | ICD-10-CM | POA: Diagnosis not present

## 2024-05-11 DIAGNOSIS — J309 Allergic rhinitis, unspecified: Secondary | ICD-10-CM | POA: Diagnosis not present

## 2024-05-11 DIAGNOSIS — J449 Chronic obstructive pulmonary disease, unspecified: Secondary | ICD-10-CM | POA: Diagnosis not present

## 2024-05-11 DIAGNOSIS — I739 Peripheral vascular disease, unspecified: Secondary | ICD-10-CM | POA: Diagnosis not present

## 2024-05-11 DIAGNOSIS — M545 Low back pain, unspecified: Secondary | ICD-10-CM | POA: Diagnosis not present

## 2024-06-01 DIAGNOSIS — N189 Chronic kidney disease, unspecified: Secondary | ICD-10-CM | POA: Diagnosis not present

## 2024-08-11 DIAGNOSIS — J309 Allergic rhinitis, unspecified: Secondary | ICD-10-CM | POA: Diagnosis not present

## 2024-08-11 DIAGNOSIS — I700 Atherosclerosis of aorta: Secondary | ICD-10-CM | POA: Diagnosis not present

## 2024-08-11 DIAGNOSIS — E440 Moderate protein-calorie malnutrition: Secondary | ICD-10-CM | POA: Diagnosis not present

## 2024-08-11 DIAGNOSIS — G603 Idiopathic progressive neuropathy: Secondary | ICD-10-CM | POA: Diagnosis not present

## 2024-08-11 DIAGNOSIS — G40309 Generalized idiopathic epilepsy and epileptic syndromes, not intractable, without status epilepticus: Secondary | ICD-10-CM | POA: Diagnosis not present

## 2024-08-11 DIAGNOSIS — F330 Major depressive disorder, recurrent, mild: Secondary | ICD-10-CM | POA: Diagnosis not present

## 2024-08-11 DIAGNOSIS — M5451 Vertebrogenic low back pain: Secondary | ICD-10-CM | POA: Diagnosis not present

## 2024-08-11 DIAGNOSIS — J449 Chronic obstructive pulmonary disease, unspecified: Secondary | ICD-10-CM | POA: Diagnosis not present

## 2024-08-11 DIAGNOSIS — Z0000 Encounter for general adult medical examination without abnormal findings: Secondary | ICD-10-CM | POA: Diagnosis not present

## 2024-08-11 DIAGNOSIS — N182 Chronic kidney disease, stage 2 (mild): Secondary | ICD-10-CM | POA: Diagnosis not present

## 2024-08-11 DIAGNOSIS — I739 Peripheral vascular disease, unspecified: Secondary | ICD-10-CM | POA: Diagnosis not present

## 2024-08-11 DIAGNOSIS — Z681 Body mass index (BMI) 19 or less, adult: Secondary | ICD-10-CM | POA: Diagnosis not present

## 2024-08-11 DIAGNOSIS — I10 Essential (primary) hypertension: Secondary | ICD-10-CM | POA: Diagnosis not present

## 2024-10-27 DIAGNOSIS — F330 Major depressive disorder, recurrent, mild: Secondary | ICD-10-CM | POA: Diagnosis not present

## 2024-10-27 DIAGNOSIS — N182 Chronic kidney disease, stage 2 (mild): Secondary | ICD-10-CM | POA: Diagnosis not present

## 2024-11-17 DIAGNOSIS — E440 Moderate protein-calorie malnutrition: Secondary | ICD-10-CM | POA: Diagnosis not present

## 2024-11-17 DIAGNOSIS — J309 Allergic rhinitis, unspecified: Secondary | ICD-10-CM | POA: Diagnosis not present

## 2024-11-17 DIAGNOSIS — M545 Low back pain, unspecified: Secondary | ICD-10-CM | POA: Diagnosis not present

## 2024-11-17 DIAGNOSIS — F330 Major depressive disorder, recurrent, mild: Secondary | ICD-10-CM | POA: Diagnosis not present

## 2024-11-17 DIAGNOSIS — I10 Essential (primary) hypertension: Secondary | ICD-10-CM | POA: Diagnosis not present

## 2024-11-17 DIAGNOSIS — G603 Idiopathic progressive neuropathy: Secondary | ICD-10-CM | POA: Diagnosis not present

## 2024-11-17 DIAGNOSIS — G40309 Generalized idiopathic epilepsy and epileptic syndromes, not intractable, without status epilepticus: Secondary | ICD-10-CM | POA: Diagnosis not present

## 2024-11-17 DIAGNOSIS — Z681 Body mass index (BMI) 19 or less, adult: Secondary | ICD-10-CM | POA: Diagnosis not present

## 2024-11-17 DIAGNOSIS — J449 Chronic obstructive pulmonary disease, unspecified: Secondary | ICD-10-CM | POA: Diagnosis not present

## 2024-11-17 DIAGNOSIS — I739 Peripheral vascular disease, unspecified: Secondary | ICD-10-CM | POA: Diagnosis not present

## 2024-11-17 DIAGNOSIS — M6281 Muscle weakness (generalized): Secondary | ICD-10-CM | POA: Diagnosis not present

## 2024-11-17 DIAGNOSIS — I700 Atherosclerosis of aorta: Secondary | ICD-10-CM | POA: Diagnosis not present
